# Patient Record
Sex: Female | Born: 1985 | Race: White | Hispanic: No | Marital: Married | Smoking: Never smoker
Health system: Southern US, Community
[De-identification: ages and names within clinical notes are randomized; demographics above are authoritative.]

## PROBLEM LIST (undated history)

## (undated) DIAGNOSIS — L409 Psoriasis, unspecified: Secondary | ICD-10-CM

## (undated) DIAGNOSIS — L4050 Arthropathic psoriasis, unspecified: Secondary | ICD-10-CM

## (undated) HISTORY — DX: Arthropathic psoriasis, unspecified: L40.50

## (undated) HISTORY — DX: Psoriasis, unspecified: L40.9

---

## 2006-03-02 ENCOUNTER — Other Ambulatory Visit: Admission: RE | Admit: 2006-03-02 | Discharge: 2006-03-02 | Payer: Self-pay | Admitting: *Deleted

## 2007-04-07 ENCOUNTER — Other Ambulatory Visit: Admission: RE | Admit: 2007-04-07 | Discharge: 2007-04-07 | Payer: Self-pay | Admitting: Family Medicine

## 2012-07-19 DIAGNOSIS — J309 Allergic rhinitis, unspecified: Secondary | ICD-10-CM | POA: Insufficient documentation

## 2014-06-14 ENCOUNTER — Encounter: Payer: Self-pay | Admitting: Sports Medicine

## 2014-06-14 ENCOUNTER — Ambulatory Visit (INDEPENDENT_AMBULATORY_CARE_PROVIDER_SITE_OTHER): Payer: BC Managed Care – PPO | Admitting: Sports Medicine

## 2014-06-14 VITALS — BP 117/77 | HR 72 | Ht 66.0 in | Wt 109.0 lb

## 2014-06-14 DIAGNOSIS — R5381 Other malaise: Secondary | ICD-10-CM | POA: Diagnosis not present

## 2014-06-14 DIAGNOSIS — K3189 Other diseases of stomach and duodenum: Secondary | ICD-10-CM | POA: Diagnosis not present

## 2014-06-14 DIAGNOSIS — K589 Irritable bowel syndrome without diarrhea: Secondary | ICD-10-CM

## 2014-06-14 DIAGNOSIS — R5383 Other fatigue: Secondary | ICD-10-CM | POA: Diagnosis not present

## 2014-06-14 DIAGNOSIS — Z0000 Encounter for general adult medical examination without abnormal findings: Secondary | ICD-10-CM | POA: Diagnosis not present

## 2014-06-14 DIAGNOSIS — R1013 Epigastric pain: Secondary | ICD-10-CM | POA: Insufficient documentation

## 2014-06-14 MED ORDER — HYOSCYAMINE SULFATE 0.125 MG PO TABS: 0.1250 mg | ORAL_TABLET | ORAL | Status: DC | PRN

## 2014-06-14 MED ORDER — ESOMEPRAZOLE MAGNESIUM 40 MG PO CPDR: DELAYED_RELEASE_CAPSULE | ORAL | Status: DC

## 2014-06-14 NOTE — Progress Notes (Signed)
  Subjective:    CC: Establish care.   HPI:  This is a very pleasant 28 year old female, she is previously healthy, unfortunately for the past several weeks she's noted vague epigastric pain associated with fatigue, and nausea when eating. She denies any hematochezia, hematemesis or melena. She also tells me whenever she eats she has to stool immediately. No constipation. Last menstrual period was about 5 weeks ago.  Past medical history, Surgical history, Family history not pertinant except as noted below, Social history, Allergies, and medications have been entered into the medical record, reviewed, and no changes needed.   Review of Systems: No headache, visual changes, nausea, vomiting, diarrhea, constipation, dizziness, abdominal pain, skin rash, fevers, chills, night sweats, swollen lymph nodes, weight loss, chest pain, body aches, joint swelling, muscle aches, shortness of breath, mood changes, visual or auditory hallucinations.  Objective:    General: Well Developed, well nourished, and in no acute distress.  Neuro: Alert and oriented x3, extra-ocular muscles intact, sensation grossly intact.  HEENT: Normocephalic, atraumatic, pupils equal round reactive to light, neck supple, no masses, no lymphadenopathy, thyroid nonpalpable.  Skin: Warm and dry, no rashes noted.  Cardiac: Regular rate and rhythm, no murmurs rubs or gallops.  Respiratory: Clear to auscultation bilaterally. Not using accessory muscles, speaking in full sentences.  Abdominal: Soft, nontender, nondistended, positive bowel sounds, no masses, no organomegaly.  Musculoskeletal: Shoulder, elbow, wrist, hip, knee, ankle stable, and with full range of motion.  Impression and Recommendations:    The patient was counselled, risk factors were discussed, anticipatory guidance given.

## 2014-06-14 NOTE — Assessment & Plan Note (Signed)
Most recent Pap smear was a year ago. She does have an OB/GYN in town.

## 2014-06-14 NOTE — Assessment & Plan Note (Signed)
Checking some blood work. Also checking urine pregnancy.

## 2014-06-14 NOTE — Assessment & Plan Note (Signed)
Nausea and burning epigastric pain with eating, associated with fatigue, I do suspect peptic ulcer disease with anemia. CBC, CMET, H. pylori, TSH.

## 2014-06-14 NOTE — Assessment & Plan Note (Signed)
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 Adding Levsin

## 2014-06-15 LAB — CBC
HCT: 42.2 % (ref 36.0–46.0)
Hemoglobin: 14.8 g/dL (ref 12.0–15.0)
MCH: 32.6 pg (ref 26.0–34.0)
MCHC: 35.1 g/dL (ref 30.0–36.0)
MCV: 93 fL (ref 78.0–100.0)
Platelets: 201 10*3/uL (ref 150–400)
RBC: 4.54 MIL/uL (ref 3.87–5.11)
RDW: 13.3 % (ref 11.5–15.5)
WBC: 5.6 10*3/uL (ref 4.0–10.5)

## 2014-06-16 LAB — COMPREHENSIVE METABOLIC PANEL
ALT: 10 U/L (ref 0–35)
Albumin: 4.5 g/dL (ref 3.5–5.2)
CO2: 25 mEq/L (ref 19–32)
Calcium: 9.1 mg/dL (ref 8.4–10.5)
Chloride: 105 mEq/L (ref 96–112)
Creat: 0.72 mg/dL (ref 0.50–1.10)
Glucose, Bld: 83 mg/dL (ref 70–99)
Potassium: 4.3 mEq/L (ref 3.5–5.3)
Total Bilirubin: 0.8 mg/dL (ref 0.2–1.2)
Total Protein: 6.8 g/dL (ref 6.0–8.3)

## 2014-06-16 LAB — LIPID PANEL
Cholesterol: 125 mg/dL (ref 0–200)
HDL: 65 mg/dL (ref >39)
LDL Cholesterol: 50 mg/dL (ref 0–99)
Total CHOL/HDL Ratio: 1.9 ratio
Triglycerides: 51 mg/dL (ref <150)
VLDL: 10 mg/dL (ref 0–40)

## 2014-06-16 LAB — COMPREHENSIVE METABOLIC PANEL WITH GFR
AST: 15 U/L (ref 0–37)
Alkaline Phosphatase: 36 U/L — ABNORMAL LOW (ref 39–117)
BUN: 16 mg/dL (ref 6–23)
Sodium: 138 meq/L (ref 135–145)

## 2014-06-16 LAB — TSH: TSH: 1.207 u[IU]/mL (ref 0.350–4.500)

## 2014-06-16 LAB — HEMOGLOBIN A1C
Hgb A1c MFr Bld: 5.2 % (ref <5.7)
Mean Plasma Glucose: 103 mg/dL (ref <117)

## 2014-06-16 LAB — PREGNANCY, URINE: Preg Test, Ur: NEGATIVE

## 2014-06-17 LAB — H. PYLORI ANTIBODY, IGG: H Pylori IgG: 0.54 {ISR}

## 2014-06-28 ENCOUNTER — Encounter: Payer: Self-pay | Admitting: Sports Medicine

## 2014-06-28 ENCOUNTER — Ambulatory Visit (INDEPENDENT_AMBULATORY_CARE_PROVIDER_SITE_OTHER): Payer: BC Managed Care – PPO | Admitting: Sports Medicine

## 2014-06-28 VITALS — BP 123/83 | HR 86 | Ht 66.0 in | Wt 109.0 lb

## 2014-06-28 DIAGNOSIS — Z7189 Other specified counseling: Secondary | ICD-10-CM

## 2014-06-28 DIAGNOSIS — Z7184 Encounter for health counseling related to travel: Secondary | ICD-10-CM | POA: Insufficient documentation

## 2014-06-28 DIAGNOSIS — K3189 Other diseases of stomach and duodenum: Secondary | ICD-10-CM

## 2014-06-28 DIAGNOSIS — R1013 Epigastric pain: Secondary | ICD-10-CM

## 2014-06-28 DIAGNOSIS — J309 Allergic rhinitis, unspecified: Secondary | ICD-10-CM

## 2014-06-28 DIAGNOSIS — K589 Irritable bowel syndrome without diarrhea: Secondary | ICD-10-CM

## 2014-06-28 MED ORDER — CIPROFLOXACIN HCL 500 MG PO TABS: 500.0000 mg | ORAL_TABLET | Freq: Two times a day (BID) | ORAL | Status: DC

## 2014-06-28 MED ORDER — ONDANSETRON 8 MG PO TBDP: 8.0000 mg | ORAL_TABLET | Freq: Three times a day (TID) | ORAL | Status: DC | PRN

## 2014-06-28 MED ORDER — SCOPOLAMINE 1 MG/3DAYS TD PT72: 1.0000 | MEDICATED_PATCH | TRANSDERMAL | Status: DC

## 2014-06-28 NOTE — Assessment & Plan Note (Signed)
No longer getting a good response to Allegra, switching to Claritin.

## 2014-06-28 NOTE — Assessment & Plan Note (Signed)
Traveling to [place]. We will give the typhoid vaccine, we do not have yellow fever in stock, not a big deal. I'm also going to give her Zofran and scopolamine patches, as well as Cipro for possible traveler's diarrhea.

## 2014-06-28 NOTE — Assessment & Plan Note (Signed)
Well controlled 

## 2014-06-28 NOTE — Progress Notes (Signed)
Patient ID: Alice Johnston, female   DOB: Sep 18, 1986, 28 y.o.   MRN: [id number]  Subjective:    CC: Follow-up on abdominal pain  HPI: Raelene Trew is a very pleasant, previously healthy, 28 year old female who presents today for follow-up on over one month of fatigue and abdominal discomfort suggestive of dyspepsia and diarrhea-predominant IBS. CBC, CMET, H. pylori, TSH, lipid panel, urine pregnancy test and HgbA1c done at our last visit were all negative/within normal limits. She was started on esomeprazole 40 mg daily and hyoscyamine 0.125 mg q4h as needed for cramping. She reports starting the esomeprazole three days ago and has not yet used the hyoscyamine. Symptoms have improved significantly, with no cramping or abdominal pain since our last visit. She does state that she quit her job on the same day as our last visit and has been on vacation for the past two weeks, which has meant significantly less stress and improved eating habits. Did have one episode of epigastric and substernal chest pain after eating fast food last week.  At this visit, she also wanted to discuss her longstanding and now uncontrolled allergic rhinitis, previously well-controlled with Allegra. Reports that she has taken Allegra for roughly 14 years and now has worsening rhinorrhea, nasal itching, and postnasal drip.   Also leaving for a trip to [place] tomorrow and expressed interest in medication to control motion sickness and nausea associated with traveling.  Past medical history, Surgical history, Family history not pertinant except as noted below, Social history, Allergies, and medications have been entered into the medical record, reviewed, and no changes needed.   Review of Systems: No fevers, chills, night sweats, weight loss, chest pain, or shortness of breath.   Objective:    General: Well developed, well nourished, and in no acute distress.  Neuro: Alert and oriented x3, extra-ocular muscles intact, sensation  grossly intact.  HEENT: Normocephalic, atraumatic, pupils equal round reactive to light, neck supple, no masses, no lymphadenopathy, thyroid nonpalpable.  Skin: Warm and dry, no rashes. Cardiac: Regular rate and rhythm, no murmurs rubs or gallops, no lower extremity edema.  Respiratory: Clear to auscultation bilaterally. Not using accessory muscles, speaking in full sentences. Abdominal Exam: Abdomen is soft and non-distended without masses. No tenderness to palpation.  Impression and Recommendations:    Irritable Bowel Syndrome: Well-controlled on current regimen. - Continue hyoscyamine 0.125 mg q4h PRN for cramping/pain  Dyspepsia: Well-controlled on current regimen. - Continue esomeprazole 40 mg daily  Allergic Rhinitis: While her allergies were previously well-controlled with Allegra, the decreasing response with long-term use suggests that we should switch to another medication. - d/c Allegra - Claritin  Travel to [place]: We discussed the benefits of receiving vaccinations prior to traveling. Typhoid and yellow fever vaccines are recommended for her; however, we do not have yellow fever in stock. She will think about if she would like to receive the typhoid vaccine. - Scopolamine and Zofran given for motion sickness - Cipro given for possible traveler's diarrhea

## 2014-07-13 ENCOUNTER — Ambulatory Visit (INDEPENDENT_AMBULATORY_CARE_PROVIDER_SITE_OTHER): Payer: BC Managed Care – PPO | Admitting: Physician Assistant

## 2014-07-13 ENCOUNTER — Encounter: Payer: Self-pay | Admitting: Physician Assistant

## 2014-07-13 VITALS — BP 121/81 | HR 85 | Ht 66.0 in | Wt 111.0 lb

## 2014-07-13 DIAGNOSIS — R300 Dysuria: Secondary | ICD-10-CM

## 2014-07-13 DIAGNOSIS — K589 Irritable bowel syndrome without diarrhea: Secondary | ICD-10-CM

## 2014-07-13 LAB — POCT URINALYSIS DIPSTICK
Bilirubin, UA: NEGATIVE
Glucose, UA: NEGATIVE
Ketones, UA: NEGATIVE
LEUKOCYTES UA: NEGATIVE
NITRITE UA: NEGATIVE
PH UA: 7
Protein, UA: NEGATIVE
RBC UA: NEGATIVE
Spec Grav, UA: 1.015
UROBILINOGEN UA: 0.2

## 2014-07-13 NOTE — Progress Notes (Signed)
   Subjective:    Patient ID: Alice Johnston, female    DOB: 02-17-1986, 28 y.o.   MRN: [id number]  HPI Pt presents to the clinic with some dysuria about 3 days ago after finishing 3 days of cipro on her [place] vacation. She ended up having UTI symptoms on vacation and started cipro that was preventively given. Symptoms resolved but then she had one day of urinary pain and increased frequency. She wants to make sure infection gone. Denies any vaginal discharge. No pain today. No fever, chills, nausea or vomiting.   She mentions she had seen for IBS by Dr. Megan Salon. She wanted to see if she had been tested for celiac disease and if not to be tested. She does eat a lot of bread and pasta. She is having a lot of diarrhea to constipation and abdominal pain off and on. She certainly admits to a level of anxiety as well. She has not started hyoscyamine.    Review of Systems  All other systems reviewed and are negative.      Objective:   Physical Exam  Constitutional: She is oriented to person, place, and time. She appears well-developed and well-nourished.  HENT:  Head: Normocephalic and atraumatic.  Cardiovascular: Normal rate, regular rhythm and normal heart sounds.   Pulmonary/Chest: Effort normal and breath sounds normal.  No CVA tenderness.   Abdominal: Soft. She exhibits no distension and no mass. There is no tenderness. There is no rebound and no guarding.  Hyperactive bowel sounds.  No tenderness.   Neurological: She is alert and oriented to person, place, and time.  Skin: Skin is dry.  Psychiatric: She has a normal mood and affect. Her behavior is normal.          Assessment & Plan:  Dysuria- .. Results for orders placed in visit on 07/13/14  POCT URINALYSIS DIPSTICK      Result Value Ref Range   Color, UA yellow     Clarity, UA slightly cloudy     Glucose, UA neg     Bilirubin, UA neg     Ketones, UA neg     Spec Grav, UA 1.015     Blood, UA neg     pH, UA 7.0      Protein, UA neg     Urobilinogen, UA 0.2     Nitrite, UA neg     Leukocytes, UA Negative      Will culture. Discussed infection has resolved and looks fine. If still having symptoms restart follow up with PCP.   IBS- discussed with pt symptoms sound consistent with IBS. Gave IbS diet and encouraged to try hyoscyamine. If not improving try gluten free for 2 weeks. I did order celiac panel. breif discussion about anxiety worsening IBS symptoms and perhaps SSRI therapy could help. Probiotic encouraged. Follow up with PCP for further management.   Spent 30 minutes with pt and greater than 50 percent of visit spent counseling pt on IBS treatment plan.

## 2014-07-13 NOTE — Patient Instructions (Addendum)
Irritable Bowel Syndrome Irritable bowel syndrome (IBS) is caused by a disturbance of normal bowel function and is a common digestive disorder. You may also hear this condition called spastic colon, mucous colitis, and irritable colon. There is no cure for IBS. However, symptoms often gradually improve or disappear with a good diet, stress management, and medicine. This condition usually appears in late adolescence or early adulthood. Women develop it twice as often as men. CAUSES  After food has been digested and absorbed in the small intestine, waste material is moved into the large intestine, or colon. In the colon, water and salts are absorbed from the undigested products coming from the small intestine. The remaining residue, or fecal material, is held for elimination. Under normal circumstances, gentle, rhythmic contractions of the bowel walls push the fecal material along the colon toward the rectum. In IBS, however, these contractions are irregular and poorly coordinated. The fecal material is either retained too long, resulting in constipation, or expelled too soon, producing diarrhea. SIGNS AND SYMPTOMS  The most common symptom of IBS is abdominal pain. It is often in the lower left side of the abdomen, but it may occur anywhere in the abdomen. The pain comes from spasms of the bowel muscles happening too much and from the buildup of gas and fecal material in the colon. This pain:  Can range from sharp abdominal cramps to a dull, continuous ache.  Often worsens soon after eating.  Is often relieved by having a bowel movement or passing gas. Abdominal pain is usually accompanied by constipation, but it may also produce diarrhea. The diarrhea often occurs right after a meal or upon waking up in the morning. The stools are often soft, watery, and flecked with mucus. Other symptoms of IBS include:  Bloating.  Loss of appetite.  Heartburn.  Backache.  Dull pain in the arms or  shoulders.  Nausea.  Burping.  Vomiting.  Gas. IBS may also cause symptoms that are unrelated to the digestive system, such as:  Fatigue.  Headaches.  Anxiety.  Shortness of breath.  Trouble concentrating.  Dizziness. These symptoms tend to come and go. DIAGNOSIS  The symptoms of IBS may seem like symptoms of other, more serious digestive disorders. Your health care provider may want to perform tests to exclude these disorders.  TREATMENT Many medicines are available to help correct bowel function or relieve bowel spasms and abdominal pain. Among the medicines available are:  Laxatives for severe constipation and to help restore normal bowel habits.  Specific antidiarrheal medicines to treat severe or lasting diarrhea.  Antispasmodic agents to relieve intestinal cramps. Your health care provider may also decide to treat you with a mild tranquilizer or sedative during unusually stressful periods in your life. Your health care provider may also prescribe antidepressant medicine. The use of this medicine has been shown to reduce pain and other symptoms of IBS. Remember that if any medicine is prescribed for you, you should take it exactly as directed. Make sure your health care provider knows how well it worked for you. HOME CARE INSTRUCTIONS   Take all medicines as directed by your health care provider.  Avoid foods that are high in fat or oils, such as heavy cream, butter, frankfurters, sausage, and other fatty meats.  Avoid foods that make you go to the bathroom, such as fruit, fruit juice, and dairy products.  Cut out carbonated drinks, chewing gum, and "gassy" foods such as beans and cabbage. This may help relieve bloating and burping.    Eat foods with bran, and drink plenty of liquids with the bran foods. This helps relieve constipation.  Keep track of what foods seem to bring on your symptoms.  Avoid emotionally charged situations or circumstances that produce  anxiety.  Start or continue exercising.  Get plenty of rest and sleep. Document Released: 10/12/2005 Document Revised: 10/17/2013 Document Reviewed: 06/01/2008 ExitCare Patient Information 2015 [employer]. This information is not intended to replace advice given to you by your health care provider. Make sure you discuss any questions you have with your health care provider. Diet and Irritable Bowel Syndrome  No cure has been found for irritable bowel syndrome (IBS). Many options are available to treat the symptoms. Your caregiver will give you the best treatments available for your symptoms. He or she will also encourage you to manage stress and to make changes to your diet. You need to work with your caregiver and Registered Dietician to find the best combination of medicine, diet, counseling, and support to control your symptoms. The following are some diet suggestions. FOODS THAT MAKE IBS WORSE  Fatty foods, such as French fries.  Milk products, such as cheese or ice cream.  Chocolate.  Alcohol.  Caffeine (found in coffee and some sodas).  Carbonated drinks, such as soda. If certain foods cause symptoms, you should eat less of them or stop eating them. FOOD JOURNAL   Keep a journal of the foods that seem to cause distress. Write down:  What you are eating during the day and when.  What problems you are having after eating.  When the symptoms occur in relation to your meals.  What foods always make you feel badly.  Take your notes with you to your caregiver to see if you should stop eating certain foods. FOODS THAT MAKE IBS BETTER Fiber reduces IBS symptoms, especially constipation, because it makes stools soft, bulky, and easier to pass. Fiber is found in bran, bread, cereal, beans, fruit, and vegetables. Examples of foods with fiber include:  Apples.  Peaches.  Pears.  Berries.  Figs.  Broccoli, raw.  Cabbage.  Carrots.  Raw peas.  Kidney  beans.  Lima beans.  Whole-grain bread.  Whole-grain cereal. Add foods with fiber to your diet a little at a time. This will let your body get used to them. Too much fiber at once might cause gas and swelling of your abdomen. This can trigger symptoms in a person with IBS. Caregivers usually recommend a diet with enough fiber to produce soft, painless bowel movements. High fiber diets may cause gas and bloating. However, these symptoms often go away within a few weeks, as your body adjusts. In many cases, dietary fiber may lessen IBS symptoms, particularly constipation. However, it may not help pain or diarrhea. High fiber diets keep the colon mildly enlarged (distended) with the added fiber. This may help prevent spasms in the colon. Some forms of fiber also keep water in the stool, thereby preventing hard stools that are difficult to pass.  Besides telling you to eat more foods with fiber, your caregiver may also tell you to get more fiber by taking a fiber pill or drinking water mixed with a special high fiber powder. An example of this is a natural fiber laxative containing psyllium seed.  TIPS  Large meals can cause cramping and diarrhea in people with IBS. If this happens to you, try eating 4 or 5 small meals a day, or try eating less at each of your usual 3 meals.   It may also help if your meals are low in fat and high in carbohydrates. Examples of carbohydrates are pasta, rice, whole-grain breads and cereals, fruits, and vegetables.  If dairy products cause your symptoms to flare up, you can try eating less of those foods. You might be able to handle yogurt better than other dairy products, because it contains bacteria that helps with digestion. Dairy products are an important source of calcium and other nutrients. If you need to avoid dairy products, be sure to talk with a Registered Dietitian about getting these nutrients through other food sources.  Drink enough water and fluids to keep  your urine clear or pale yellow. This is important, especially if you have diarrhea. FOR MORE INFORMATION  International Foundation for Functional Gastrointestinal Disorders: www.iffgd.org  National Digestive Diseases Information Clearinghouse: digestive.niddk.nih.gov Document Released: 01/02/2004 Document Revised: 01/04/2012 Document Reviewed: 01/12/2014 ExitCare Patient Information 2015 [employer]. This information is not intended to replace advice given to you by your health care provider. Make sure you discuss any questions you have with your health care provider.  

## 2014-07-15 LAB — URINE CULTURE
Colony Count: NO GROWTH
Organism ID, Bacteria: NO GROWTH

## 2014-07-16 LAB — GLIA (IGA/G) + TTG IGA
Gliadin IgA: 8.6 U/mL (ref <20)
Gliadin IgG: 7.3 U/mL (ref <20)
TISSUE TRANSGLUTAMINASE AB, IGA: 3.2 U/mL (ref <20)

## 2014-08-30 ENCOUNTER — Ambulatory Visit (INDEPENDENT_AMBULATORY_CARE_PROVIDER_SITE_OTHER): Payer: BC Managed Care – PPO | Admitting: Sports Medicine

## 2014-08-30 ENCOUNTER — Encounter: Payer: Self-pay | Admitting: Sports Medicine

## 2014-08-30 ENCOUNTER — Telehealth: Payer: Self-pay | Admitting: *Deleted

## 2014-08-30 DIAGNOSIS — K589 Irritable bowel syndrome without diarrhea: Secondary | ICD-10-CM

## 2014-08-30 NOTE — Telephone Encounter (Signed)
Approval obtained via portal. Valid 11/5-12/4/15. [id number]. Radiology notified. Corliss SkainsJamie Kenlee Maler, CMA

## 2014-08-30 NOTE — Assessment & Plan Note (Signed)
Persistent tenesmus with eating, usually larger meals such as dinner to the point where she avoids going out. Celiac panel was negative. Has not yet started Levsin. Again discussed increasing fiber in the diet. Try Levsin, we could certainly try one of the newer IBS medications if this doesn't work. Her pain is now constant and left lower quadrant so we will obtain a CT of the abdomen and pelvis. Recent visit with the gynecologist was negative. Return to see me in one month. I do not think she will need a colonoscopy unless we see something on the CT scan, or if conservative measures fail. We also discussed treatment of her anxiety, we can keep this in the back of our head, and pull the trigger for treatment if typical agents fail.

## 2014-08-30 NOTE — Progress Notes (Signed)
  Subjective:    CC: follow-up  HPI: Irritable bowel syndrome: Alice Johnston returns, she still has not taken her Levsin, not unexpectedly continues to have symptoms, she tells me she gets great anxiety from not being able to go out and eat with the worry that she will have to take an immediate bowel movement. This typically occurs at dinner and larger meals. She did see one of my partners who obtained a celiac panel that was negative. She now endorses pain that is constant in the left lower quadrant, no melena or hematochezia, no nausea. Symptoms continue to resemble irritable bowel syndrome. She is not yet ready to consider treatment of her anxiety.  Past medical history, Surgical history, Family history not pertinant except as noted below, Social history, Allergies, and medications have been entered into the medical record, reviewed, and no changes needed.   Review of Systems: No fevers, chills, night sweats, weight loss, chest pain, or shortness of breath.   Objective:    General: Well Developed, well nourished, and in no acute distress.  Neuro: Alert and oriented x3, extra-ocular muscles intact, sensation grossly intact.  HEENT: Normocephalic, atraumatic, pupils equal round reactive to light, neck supple, no masses, no lymphadenopathy, thyroid nonpalpable.  Skin: Warm and dry, no rashes. Cardiac: Regular rate and rhythm, no murmurs rubs or gallops, no lower extremity edema.  Respiratory: Clear to auscultation bilaterally. Not using accessory muscles, speaking in full sentences. Abdomen: Soft, mild left lower quadrant tenderness, nondistended, normal bowel sounds, no palpable masses.  Impression and Recommendations:

## 2014-09-03 ENCOUNTER — Ambulatory Visit (INDEPENDENT_AMBULATORY_CARE_PROVIDER_SITE_OTHER): Payer: BC Managed Care – PPO

## 2014-09-03 DIAGNOSIS — R109 Unspecified abdominal pain: Secondary | ICD-10-CM

## 2014-09-03 DIAGNOSIS — K589 Irritable bowel syndrome without diarrhea: Secondary | ICD-10-CM

## 2014-09-03 MED ORDER — IOHEXOL 300 MG/ML  SOLN
100.0000 mL | Freq: Once | INTRAMUSCULAR | Status: AC | PRN
  Administered 2014-09-03: 100 mL via INTRAVENOUS

## 2014-10-01 ENCOUNTER — Ambulatory Visit (INDEPENDENT_AMBULATORY_CARE_PROVIDER_SITE_OTHER): Payer: BC Managed Care – PPO | Admitting: Sports Medicine

## 2014-10-01 ENCOUNTER — Encounter: Payer: Self-pay | Admitting: Sports Medicine

## 2014-10-01 DIAGNOSIS — K589 Irritable bowel syndrome without diarrhea: Secondary | ICD-10-CM | POA: Diagnosis not present

## 2014-10-01 MED ORDER — HYOSCYAMINE SULFATE ER 0.375 MG PO TB12: ORAL_TABLET | ORAL | Status: DC

## 2014-10-01 NOTE — Assessment & Plan Note (Signed)
Symptoms have now essentially resolved, finally tried the hyoscyamine. Using it 2-3 times per day, I am going to try to find an extended release form for her.

## 2014-10-01 NOTE — Progress Notes (Signed)
  Subjective:    CC: Follow-up  HPI: Alice Johnston is exquisitely pleasant 28 year old female, I diagnosed her with irritable bowel syndrome, symptoms were diarrhea predominant and centered around larger meals. We started Levsin, she had not yet tried any. More recently she developed increasing left lower quadrant abdominal pain, and did request advanced imaging. We complied, in the form of the CT of the abdomen and pelvis with IV and oral contrast which was expectedly normal. More recently she did try her Levsin and was tremendously effective, she is extremely happy with the results so far.  Past medical history, Surgical history, Family history not pertinant except as noted below, Social history, Allergies, and medications have been entered into the medical record, reviewed, and no changes needed.   Review of Systems: No fevers, chills, night sweats, weight loss, chest pain, or shortness of breath.   Objective:    General: Well Developed, well nourished, and in no acute distress.  Neuro: Alert and oriented x3, extra-ocular muscles intact, sensation grossly intact.  HEENT: Normocephalic, atraumatic, pupils equal round reactive to light, neck supple, no masses, no lymphadenopathy, thyroid nonpalpable.  Skin: Warm and dry, no rashes. Cardiac: Regular rate and rhythm, no murmurs rubs or gallops, no lower extremity edema.  Respiratory: Clear to auscultation bilaterally. Not using accessory muscles, speaking in full sentences.  Impression and Recommendations:

## 2014-10-30 ENCOUNTER — Other Ambulatory Visit: Payer: Self-pay

## 2014-10-30 DIAGNOSIS — K589 Irritable bowel syndrome without diarrhea: Secondary | ICD-10-CM

## 2014-10-30 MED ORDER — HYOSCYAMINE SULFATE ER 0.375 MG PO TB12: ORAL_TABLET | ORAL | Status: DC

## 2014-12-31 ENCOUNTER — Ambulatory Visit (INDEPENDENT_AMBULATORY_CARE_PROVIDER_SITE_OTHER): Payer: BLUE CROSS/BLUE SHIELD | Admitting: Sports Medicine

## 2014-12-31 ENCOUNTER — Encounter: Payer: Self-pay | Admitting: Sports Medicine

## 2014-12-31 VITALS — BP 112/67 | HR 68 | Ht 67.0 in | Wt 103.0 lb

## 2014-12-31 DIAGNOSIS — K589 Irritable bowel syndrome without diarrhea: Secondary | ICD-10-CM

## 2014-12-31 DIAGNOSIS — Z681 Body mass index (BMI) 19 or less, adult: Secondary | ICD-10-CM

## 2014-12-31 DIAGNOSIS — Z0000 Encounter for general adult medical examination without abnormal findings: Secondary | ICD-10-CM

## 2014-12-31 NOTE — Assessment & Plan Note (Signed)
Doing extremely well with current extended release Levsin. It is somewhat expensive so she will let me know later when if she wants to switch to immediate release.

## 2014-12-31 NOTE — Assessment & Plan Note (Signed)
Pap smear was last summer and clear.

## 2014-12-31 NOTE — Assessment & Plan Note (Signed)
Normal CBC, metabolic panel, TSH. She does have some mild anxiety, exercise is adequate, and not excessive. I do think she simply constitutionally has a low body weight. No treatment needed.

## 2014-12-31 NOTE — Progress Notes (Signed)
  Subjective:    CC:  Follow up on IBS symptoms  HPI: Patient reports that she continues to have significant relief of her symptoms on the extended release Levsin. She can only recall a single episode of abdominal pain and fecal urgency over the past month, which she attributes to a viral gastroenteritis she had at the time. Today she complains of an unintended 15 lb weight loss over the past year.The weight loss has worried her boyfriend but she feels otherwise well, denies fatigue, fever, chills, night sweats, abdominal pain. The patient describes her healthy and balanced diet, and her moderate exercise of jogging 3-4 times per week. She reports that she has not performed weight bearing exercise over the past 6 months at a the recommendation of her chiropractor, but she can now. She also reports that she sometimes feels anxious despite a recent decrease in her life stressors with a new job.   Past medical history, Surgical history, Family history not pertinant except as noted below, Social history, Allergies, and medications have been entered into the medical record, reviewed, and no changes needed.   Review of Systems: No fevers, chills, night sweats, weight loss, chest pain, or shortness of breath.   Objective:    General: Well Developed, well nourished, and in no acute distress.  Neuro: Alert and oriented x3, extra-ocular muscles intact, sensation grossly intact.  HEENT: Normocephalic, atraumatic, pupils equal round reactive to light, neck supple, no masses, no lymphadenopathy, thyroid nonpalpable.  Skin: Warm and dry, no rashes. Cardiac: Regular rate and rhythm, no murmurs rubs or gallops, no lower extremity edema.  Respiratory: Clear to auscultation bilaterally. Not using accessory muscles, speaking in full sentences.   Impression and Recommendations:    # Irritable Bowel Syndrome - Patient's symptoms have resolved with medical management - Continue hyoscyamine 0.375 extended release  formula daily with lunch  # Low BMI - Body mass index is 16.13 kg/(m^2). today - Patient does not appear to be over-exercising, or restricting her diet - Metabolic workup was negative with CBC and TSH both WNL - Provided reassurance, encouraged patient to maintain a healthy diet and return to moderate weight-bearing exercise  # Anxiety - Patient will fill out PHQ9 and GAD forms today   Follow up in 6 months or as needed

## 2015-07-08 ENCOUNTER — Encounter: Payer: Self-pay | Admitting: Sports Medicine

## 2015-07-08 ENCOUNTER — Ambulatory Visit (INDEPENDENT_AMBULATORY_CARE_PROVIDER_SITE_OTHER): Payer: BLUE CROSS/BLUE SHIELD | Admitting: Sports Medicine

## 2015-07-08 VITALS — BP 105/64 | HR 74 | Ht 65.0 in | Wt 103.0 lb

## 2015-07-08 DIAGNOSIS — K589 Irritable bowel syndrome without diarrhea: Secondary | ICD-10-CM

## 2015-07-08 DIAGNOSIS — Z681 Body mass index (BMI) 19 or less, adult: Secondary | ICD-10-CM

## 2015-07-08 MED ORDER — HYOSCYAMINE SULFATE ER 0.375 MG PO TB12: 0.3750 mg | ORAL_TABLET | Freq: Every day | ORAL | Status: DC

## 2015-07-08 NOTE — Assessment & Plan Note (Signed)
Doing extremely well on Levbid. Refilling for one year.

## 2015-07-08 NOTE — Assessment & Plan Note (Signed)
We discussed various options to gain weight, both pharmacologic, and physiologic. Options would include mirtazapine, Megace, Marinol, and Depo-Provera. She is probably going to consider Depo-Provera, she will come back for nurse visit, we would need a pregnancy test first.

## 2015-07-08 NOTE — Progress Notes (Signed)
  Subjective:    CC: Follow-up  HPI: Alice Johnston is a very pleasant 29 year old female with diarrhea predominant irritable bowel syndrome, she has done extremely well with Levbid, and would like a refill, symptoms are essentially completely controlled.  Low BMI: Alice Johnston bodyweight is constitutionally low, she has had a normal TSH, complete metabolic panel, CBC. We went over her diet, this appears to be adequate. She does desire to think about pharmacologic intervention for weight gain. She only runs 1 mile twice a week.  Past medical history, Surgical history, Family history not pertinant except as noted below, Social history, Allergies, and medications have been entered into the medical record, reviewed, and no changes needed.   Review of Systems: No fevers, chills, night sweats, weight loss, chest pain, or shortness of breath.   Objective:    General: Well Developed, well nourished, and in no acute distress.  Neuro: Alert and oriented x3, extra-ocular muscles intact, sensation grossly intact.  HEENT: Normocephalic, atraumatic, pupils equal round reactive to light, neck supple, no masses, no lymphadenopathy, thyroid nonpalpable.  Skin: Warm and dry, no rashes. Cardiac: Regular rate and rhythm, no murmurs rubs or gallops, no lower extremity edema.  Respiratory: Clear to auscultation bilaterally. Not using accessory muscles, speaking in full sentences.  Impression and Recommendations:    I spent 40 minutes with this patient, greater than 50% was face-to-face time counseling regarding the above diagnoses

## 2015-12-12 ENCOUNTER — Ambulatory Visit (INDEPENDENT_AMBULATORY_CARE_PROVIDER_SITE_OTHER): Payer: BLUE CROSS/BLUE SHIELD | Admitting: Sports Medicine

## 2015-12-12 ENCOUNTER — Encounter: Payer: Self-pay | Admitting: Sports Medicine

## 2015-12-12 VITALS — BP 122/72 | HR 74 | Resp 16 | Wt 108.0 lb

## 2015-12-12 DIAGNOSIS — K580 Irritable bowel syndrome with diarrhea: Secondary | ICD-10-CM

## 2015-12-12 DIAGNOSIS — Z3009 Encounter for other general counseling and advice on contraception: Secondary | ICD-10-CM

## 2015-12-12 DIAGNOSIS — Z681 Body mass index (BMI) 19 or less, adult: Secondary | ICD-10-CM

## 2015-12-12 DIAGNOSIS — F411 Generalized anxiety disorder: Secondary | ICD-10-CM | POA: Insufficient documentation

## 2015-12-12 LAB — POCT URINE PREGNANCY: Preg Test, Ur: NEGATIVE

## 2015-12-12 MED ORDER — ELUXADOLINE 75 MG PO TABS: 1.0000 | ORAL_TABLET | Freq: Two times a day (BID) | ORAL | Status: DC

## 2015-12-12 MED ORDER — HYOSCYAMINE SULFATE 0.125 MG PO TABS: 0.2500 mg | ORAL_TABLET | Freq: Two times a day (BID) | ORAL | Status: DC | PRN

## 2015-12-12 MED ORDER — MEDROXYPROGESTERONE ACETATE 150 MG/ML IM SUSP
150.0000 mg | Freq: Once | INTRAMUSCULAR | Status: AC
  Administered 2015-12-12: 150 mg via INTRAMUSCULAR

## 2015-12-12 NOTE — Addendum Note (Signed)
Addended by: Baird Kay on: 12/12/2015 04:17 PM   Modules accepted: Orders, Medications

## 2015-12-12 NOTE — Assessment & Plan Note (Signed)
Adding Depo-Provera.

## 2015-12-12 NOTE — Assessment & Plan Note (Signed)
Urine pregnancy test is negative, return for Depo-Provera shot every 3 months.

## 2015-12-12 NOTE — Assessment & Plan Note (Signed)
We discussed generalized anxiety, its association with irritable bowel syndrome and treatment including SSRIs, behavioral therapy, as well as the mechanism of an SSRI. She is going to think about it and call me and let me know whether she wants to start something, we will probably start with Celexa.

## 2015-12-12 NOTE — Assessment & Plan Note (Signed)
Well-controlled on Levbid, unfortunately no longer covered by insurance. Switching to standard release Levsin, adding Viberzi as well with coupon. If this is too expensive we can certainly consider Bentyl.

## 2015-12-12 NOTE — Addendum Note (Signed)
Addended by: Baird Kay on: 12/12/2015 04:14 PM   Modules accepted: Orders

## 2015-12-12 NOTE — Progress Notes (Signed)
  Subjective:    CC: Couple of issues  HPI: Alice Johnston is a pleasant 30 year old female, she comes in for follow-up of her diarrhea predominant urine will bowel syndrome, she had a fantastic response to Levbid, unfortunately her insurance will no longer cover it.  Anxiety: Notes moderate nervousness, worrying about different things, irritability, and mild difficulty controlling her for a, if the relaxing, and fear of impending doom, no suicidal or homicidal ideation.  Underweight: Is agreeable to try Depo-Provera as her form of birth control, as this will also help her to gain some weight.  Past medical history, Surgical history, Family history not pertinant except as noted below, Social history, Allergies, and medications have been entered into the medical record, reviewed, and no changes needed.   Review of Systems: No fevers, chills, night sweats, weight loss, chest pain, or shortness of breath.   Objective:    General: Well Developed, well nourished, and in no acute distress.  Neuro: Alert and oriented x3, extra-ocular muscles intact, sensation grossly intact.  HEENT: Normocephalic, atraumatic, pupils equal round reactive to light, neck supple, no masses, no lymphadenopathy, thyroid nonpalpable.  Skin: Warm and dry, no rashes. Cardiac: Regular rate and rhythm, no murmurs rubs or gallops, no lower extremity edema.  Respiratory: Clear to auscultation bilaterally. Not using accessory muscles, speaking in full sentences.  Impression and Recommendations:    I spent 25 minutes with this patient, greater than 50% was face-to-face time counseling regarding the above diagnoses

## 2016-01-09 ENCOUNTER — Ambulatory Visit: Payer: BLUE CROSS/BLUE SHIELD | Admitting: Sports Medicine

## 2016-01-30 ENCOUNTER — Encounter (HOSPITAL_BASED_OUTPATIENT_CLINIC_OR_DEPARTMENT_OTHER): Payer: Self-pay | Admitting: *Deleted

## 2016-01-30 ENCOUNTER — Emergency Department (HOSPITAL_BASED_OUTPATIENT_CLINIC_OR_DEPARTMENT_OTHER)
Admission: EM | Admit: 2016-01-30 | Discharge: 2016-01-31 | Disposition: A | Discharge status: Home / Self Care | Payer: BLUE CROSS/BLUE SHIELD | Attending: Emergency Medicine | Admitting: Emergency Medicine

## 2016-01-30 DIAGNOSIS — N926 Irregular menstruation, unspecified: Secondary | ICD-10-CM | POA: Diagnosis present

## 2016-01-30 DIAGNOSIS — Z0389 Encounter for observation for other suspected diseases and conditions ruled out: Secondary | ICD-10-CM | POA: Insufficient documentation

## 2016-01-30 NOTE — ED Notes (Signed)
Pt states she thinks she has a tampon stuck in her vagina.

## 2016-01-30 NOTE — ED Notes (Signed)
MD at bedside. 

## 2016-01-30 NOTE — ED Notes (Signed)
Tampon placed at 1830 and was attempted to be taken out to an hour ago and she cant find the string to the tampon or feel the tampon.

## 2016-01-30 NOTE — ED Provider Notes (Signed)
CSN: [id number]     Arrival date & time 01/30/16  2325 History  By signing my name below, I, [name], attest that this documentation has been prepared under the direction and in the presence of Elisia Stepp, MD. Electronically Signed: [name], ED Scribe. 01/30/2016. 11:49 PM.    Chief Complaint  Patient presents with  . Foreign Body in Vagina   Patient is a 30 y.o. female presenting with foreign body in vagina. The history is provided by the patient. No language interpreter was used.  Foreign Body in Vagina This is a new problem. The current episode started 1 to 2 hours ago. The problem occurs constantly. The problem has not changed since onset.Pertinent negatives include no chest pain, no abdominal pain, no headaches and no shortness of breath. Nothing aggravates the symptoms. Nothing relieves the symptoms. She has tried nothing (removal) for the symptoms. The treatment provided no relief.   HPI Comments: Alice Johnston is a 30 y.o. female who presents to the Emergency Department complaining that she has been unable to remove the tampon that she inserted at 6:30PM. Pt states she noticed that she was unable to find the string one hour ago. She has urinated since insertion and has not seen or felt the tampon fall out at any point. Pt states she also had her boyfriend check for the tampon with no success. Denies vaginal pain.   History reviewed. No pertinent past medical history. History reviewed. No pertinent past surgical history. Family History  Problem Relation Age of Onset  . Depression Mother   . Depression Father   . Diabetes Father   . Hypertension Father   . Stroke Father   . Cancer Maternal Uncle   . Heart attack Paternal Uncle   . Cancer Paternal Grandfather   . Heart attack Paternal Grandfather   . Hypertension Paternal Grandfather    Social History  Substance Use Topics  . Smoking status: Never Smoker   . Smokeless tobacco: None  . Alcohol Use: Yes   OB History     No data available     Review of Systems  Respiratory: Negative for shortness of breath.   Cardiovascular: Negative for chest pain.  Gastrointestinal: Negative for abdominal pain.  Genitourinary: Negative for vaginal pain.       +foreign body sensation to vagina   Neurological: Negative for headaches.  All other systems reviewed and are negative.  Allergies  Review of patient's allergies indicates no known allergies.  Home Medications   Prior to Admission medications   Medication Sig Start Date End Date Taking? Authorizing Provider  Eluxadoline (VIBERZI) 75 MG TABS Take 1 tablet by mouth 2 (two) times daily. 12/12/15   Monica Becton, MD  hyoscyamine (LEVSIN, ANASPAZ) 0.125 MG tablet Take 2 tablets (0.25 mg total) by mouth 2 (two) times daily as needed for cramping. 12/12/15   Monica Becton, MD  Loratadine (CLARITIN PO) Take by mouth as needed.    Historical Provider, MD   BP 128/91 mmHg  Pulse 80  Temp(Src) 97.6 F (36.4 C)  Resp 18  Ht  (1.676 m)  Wt 110 lb (49.896 kg)  BMI 17.76 kg/m2  SpO2 100%  LMP 01/30/2016 Physical Exam  Constitutional: She is oriented to person, place, and time. She appears well-developed and well-nourished.  HENT:  Head: Normocephalic and atraumatic.  Mouth/Throat: Oropharynx is clear and moist. No oropharyngeal exudate.  Eyes: Conjunctivae and EOM are normal. Pupils are equal, round, and reactive to light.  Neck: Normal range of motion. Neck supple. No JVD present. No tracheal deviation present.  Cardiovascular: Normal rate, regular rhythm and normal heart sounds.  Exam reveals no gallop and no friction rub.   No murmur heard. RRR.   Pulmonary/Chest: Effort normal and breath sounds normal. No stridor. No respiratory distress. She has no wheezes. She has no rales.  Lungs CTA bilaterally.   Abdominal: Soft. Bowel sounds are normal. She exhibits no distension. There is no tenderness. There is no rebound and no guarding.   Genitourinary: Vagina normal. No vaginal discharge found.  Two chaperones present throughout entire exam.  No foreign body. Nurse Lupita Leashonna confirmed that there was no foreign body with speculum exam in vaginal vault. Cervical os closed. Scant vaginal bleeding.   Musculoskeletal: Normal range of motion.  Lymphadenopathy:    She has no cervical adenopathy.  Neurological: She is alert and oriented to person, place, and time. She has normal reflexes.  Skin: Skin is warm and dry.  Psychiatric: She has a normal mood and affect.  Nursing note and vitals reviewed.   ED Course  Procedures (including critical care time) DIAGNOSTIC STUDIES: Oxygen Saturation is 100% on RA, normal by my interpretation.    COORDINATION OF CARE: 11:44 PM Discussed treatment plan with pt at bedside which includes tampon removal and pt agreed to plan. If no foreign body, f/u with OB/GYN in 48 hours for recheck. No foreign body on speculum exam. Nurse Lupita LeashDonna confirmed that there was no foreign body with speculum exam in vaginal vault. Pt stable for discharge.     MDM   Final diagnoses:  Menstrual problem    Well appearing no tampon nor string visible.  No CMT.  Os is closed suspect it fell out earlier and patient was unaware of its absence.  Stable for discharge at this time  I personally performed the services described in this documentation, which was scribed in my presence. The recorded information has been reviewed and is accurate.     Cy BlamerApril Britt Petroni, MD 01/31/16 [phone]

## 2016-01-31 ENCOUNTER — Encounter (HOSPITAL_BASED_OUTPATIENT_CLINIC_OR_DEPARTMENT_OTHER): Payer: Self-pay | Admitting: Emergency Medicine

## 2016-02-04 ENCOUNTER — Ambulatory Visit: Payer: BLUE CROSS/BLUE SHIELD | Admitting: Sports Medicine

## 2016-02-11 ENCOUNTER — Ambulatory Visit: Payer: BLUE CROSS/BLUE SHIELD | Admitting: Sports Medicine

## 2016-02-25 ENCOUNTER — Ambulatory Visit (INDEPENDENT_AMBULATORY_CARE_PROVIDER_SITE_OTHER): Payer: BLUE CROSS/BLUE SHIELD

## 2016-02-25 ENCOUNTER — Other Ambulatory Visit: Payer: Self-pay | Admitting: Sports Medicine

## 2016-02-25 ENCOUNTER — Ambulatory Visit (INDEPENDENT_AMBULATORY_CARE_PROVIDER_SITE_OTHER): Payer: BLUE CROSS/BLUE SHIELD | Admitting: Sports Medicine

## 2016-02-25 ENCOUNTER — Encounter: Payer: Self-pay | Admitting: Sports Medicine

## 2016-02-25 VITALS — BP 117/70 | HR 78 | Resp 16 | Wt 111.0 lb

## 2016-02-25 DIAGNOSIS — S6992XA Unspecified injury of left wrist, hand and finger(s), initial encounter: Secondary | ICD-10-CM

## 2016-02-25 MED ORDER — TRAMADOL HCL 50 MG PO TABS: ORAL_TABLET | ORAL | Status: DC
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
## 2016-02-27 ENCOUNTER — Ambulatory Visit: Payer: BLUE CROSS/BLUE SHIELD | Admitting: Sports Medicine

## 2016-03-03 ENCOUNTER — Ambulatory Visit: Payer: BLUE CROSS/BLUE SHIELD

## 2016-03-04 ENCOUNTER — Ambulatory Visit (INDEPENDENT_AMBULATORY_CARE_PROVIDER_SITE_OTHER): Payer: BLUE CROSS/BLUE SHIELD | Admitting: Sports Medicine

## 2016-03-04 VITALS — BP 125/80 | HR 96 | Ht 65.0 in | Wt 109.0 lb

## 2016-03-04 DIAGNOSIS — Z309 Encounter for contraceptive management, unspecified: Secondary | ICD-10-CM | POA: Diagnosis not present

## 2016-03-04 MED ORDER — MEDROXYPROGESTERONE ACETATE 150 MG/ML IM SUSP
150.0000 mg | INTRAMUSCULAR | Status: DC
  Administered 2016-03-04: 150 mg via INTRAMUSCULAR

## 2016-03-04 NOTE — Progress Notes (Signed)
Patient was in office for  Medroxy Progesterone injection. 150 mg was given LUOQ patient did state that she has been bleeding almost everyday since she started getting the injection 3 months ago.. Patient scheduled appointment to come back in 3 months for next injection. Deyna Carbon,CMA

## 2016-03-10 ENCOUNTER — Ambulatory Visit (INDEPENDENT_AMBULATORY_CARE_PROVIDER_SITE_OTHER): Payer: BLUE CROSS/BLUE SHIELD | Admitting: Sports Medicine

## 2016-03-10 ENCOUNTER — Encounter: Payer: Self-pay | Admitting: Sports Medicine

## 2016-03-10 VITALS — BP 117/74 | HR 87 | Resp 16 | Wt 110.4 lb

## 2016-03-10 DIAGNOSIS — S6992XD Unspecified injury of left wrist, hand and finger(s), subsequent encounter: Secondary | ICD-10-CM

## 2016-03-10 NOTE — Progress Notes (Signed)
  Subjective:    CC: Followup  HPI: Patient returns, At the last visit we did a CT looking for scaphoid injury, there was simply posttraumatic tendinitis in the first extensor compartment, she's been in a thumb spica brace since then and overall is doing significantly better.  Past medical history, Surgical history, Family history not pertinant except as noted below, Social history, Allergies, and medications have been entered into the medical record, reviewed, and no changes needed.   Review of Systems: No fevers, chills, night sweats, weight loss, chest pain, or shortness of breath.   Objective:    General: Well Developed, well nourished, and in no acute distress.  Neuro: Alert and oriented x3, extra-ocular muscles intact, sensation grossly intact.  HEENT: Normocephalic, atraumatic, pupils equal round reactive to light, neck supple, no masses, no lymphadenopathy, thyroid nonpalpable.  Skin: Warm and dry, no rashes. Cardiac: Regular rate and rhythm, no murmurs rubs or gallops, no lower extremity edema.  Respiratory: Clear to auscultation bilaterally. Not using accessory muscles, speaking in full sentences. Left Wrist: Inspection normal with no visible erythema or swelling. ROM smooth and normal with good flexion and extension and ulnar/radial deviation that is symmetrical with opposite wrist. Palpation is normal over metacarpals, navicular, lunate, and TFCC; tendons without tenderness/ swelling No snuffbox tenderness. No tenderness over Canal of [place]. Strength 5/5 in all directions without pain. Minimally positive Finkelstein sign Negative Watson's test.  Impression and Recommendations:

## 2016-03-10 NOTE — Assessment & Plan Note (Signed)
CT showed atraumatic first extensor compartment tendinitis. Improving significantly, de Quervain's exercises given, may intermittently come out of the brace. Return if no better in one month, we will do a first extensor compartment injection.

## 2016-05-27 ENCOUNTER — Ambulatory Visit: Payer: BLUE CROSS/BLUE SHIELD

## 2016-06-01 ENCOUNTER — Ambulatory Visit: Payer: BLUE CROSS/BLUE SHIELD

## 2016-06-03 ENCOUNTER — Ambulatory Visit (INDEPENDENT_AMBULATORY_CARE_PROVIDER_SITE_OTHER): Payer: Self-pay | Admitting: Sports Medicine

## 2016-06-03 ENCOUNTER — Telehealth: Payer: Self-pay | Admitting: *Deleted

## 2016-06-03 VITALS — BP 130/83 | HR 87 | Wt 112.0 lb

## 2016-06-03 DIAGNOSIS — Z3042 Encounter for surveillance of injectable contraceptive: Secondary | ICD-10-CM

## 2016-06-03 MED ORDER — MEDROXYPROGESTERONE ACETATE 150 MG/ML IM SUSP
150.0000 mg | Freq: Once | INTRAMUSCULAR | Status: AC
  Administered 2016-06-03: 150 mg via INTRAMUSCULAR

## 2016-06-03 NOTE — Telephone Encounter (Signed)
Urine pregnancy test but also may give Depoprovera

## 2016-06-03 NOTE — Telephone Encounter (Signed)
Patient reports bleeding ever since she started the depo shot( spotting is normal of course but patient specifically states its more heavy than simple spotting) she is due to come in this am for 3 depo shot. Please advise

## 2016-06-03 NOTE — Progress Notes (Signed)
Patient is here for Depo Provera injection. She states she has had some bleeding ever since she started the injection. She denies cramping, headaches, back pain.   Advised patient that breakthrough bleeding is common. Per Dr. Benjamin Stainhekkekandam instructed patient that we should do a pregnancy test today. The patient states she does not have insurance and wants to know if the test is absolutely necessary. She has not had sex in six months. Spoke with Dr. Benjamin Stainhekkekandam and per his advise instructed patient to obtain pregnancy test from the pharmacy and call us with the results. Gave patient calender with window in which to schedule next appt. She has an upcoming appointment in October with her GYN and will decide if she wants to start OCP's then.

## 2016-06-16 ENCOUNTER — Telehealth: Payer: Self-pay | Admitting: Sports Medicine

## 2016-06-16 DIAGNOSIS — G43809 Other migraine, not intractable, without status migrainosus: Secondary | ICD-10-CM

## 2016-06-16 DIAGNOSIS — G43909 Migraine, unspecified, not intractable, without status migrainosus: Secondary | ICD-10-CM | POA: Insufficient documentation

## 2016-06-16 MED ORDER — RIZATRIPTAN BENZOATE 10 MG PO TBDP: 10.0000 mg | ORAL_TABLET | ORAL | 3 refills | Status: DC | PRN

## 2016-06-16 NOTE — Assessment & Plan Note (Signed)
Adding Maxalt

## 2016-06-16 NOTE — Telephone Encounter (Signed)
Patient called clinic stating the following: 1. She has had migraines off and on since last Depo shot. Pt reports they began the day after getting injection and last 2-3 days at a time. 2. Pt reports she has had constant nausea since last injection. Pt states it feels like "morning sickness" but last all day. 3. Pt is still bleeding. Reports it is "getting better" but not stopped. 4. Pt has taken home pregnancy test, all negative.  Questions if these symptoms are "normal" and will go away. Will route to PCP for review and recommendation.

## 2016-06-16 NOTE — Telephone Encounter (Signed)
Sounds like migraine, calling in Maxalt

## 2016-06-16 NOTE — Telephone Encounter (Signed)
Pt advised of new Rx and recommendation. States she has some left over Zofran 4mg  from when she had a stomach virus last year. Questions if OK to take. I advised Pt as long as Rx is in date, and she didn't have negative side effects last time she took it, then it was OK to take. Pt will try new Rx and Zofran. If no relief, she is advised to contact clinic.  Pt also questioned if she could get an antibiotic for a possible UTI. Advised she would have to come in for a visit regarding that concern. States she will call clinic to set up a time to come in. No further questions at this time.

## 2016-06-30 ENCOUNTER — Ambulatory Visit (INDEPENDENT_AMBULATORY_CARE_PROVIDER_SITE_OTHER): Payer: BLUE CROSS/BLUE SHIELD | Admitting: Sports Medicine

## 2016-06-30 ENCOUNTER — Encounter: Payer: Self-pay | Admitting: Sports Medicine

## 2016-06-30 DIAGNOSIS — Z3009 Encounter for other general counseling and advice on contraception: Secondary | ICD-10-CM

## 2016-06-30 DIAGNOSIS — F411 Generalized anxiety disorder: Secondary | ICD-10-CM

## 2016-06-30 DIAGNOSIS — G43809 Other migraine, not intractable, without status migrainosus: Secondary | ICD-10-CM

## 2016-06-30 MED ORDER — CEPHALEXIN 500 MG PO CAPS
500.0000 mg | ORAL_CAPSULE | Freq: Two times a day (BID) | ORAL | 0 refills | Status: DC
Start: 2016-06-30 — End: 2018-05-24

## 2016-06-30 MED ORDER — ESTRADIOL 2 MG PO TABS: 2.0000 mg | ORAL_TABLET | Freq: Every day | ORAL | 0 refills | Status: DC

## 2016-06-30 MED ORDER — ONDANSETRON 8 MG PO TBDP: 8.0000 mg | ORAL_TABLET | Freq: Three times a day (TID) | ORAL | 3 refills | Status: DC | PRN

## 2016-06-30 MED ORDER — ALPRAZOLAM 0.5 MG PO TABS: 0.5000 mg | ORAL_TABLET | Freq: Every day | ORAL | 3 refills | Status: DC | PRN

## 2016-06-30 NOTE — Assessment & Plan Note (Addendum)
Unfortunately continues to have intermittent bleeding, she has been on Depo-provera for months and months and months. Recent shot was last month. Her honeymoon is coming up soon, and I would like to help her stop bleeding, I did discuss this with OB/GYN, they recommended Estrace 2 mg daily for 2 weeks.

## 2016-06-30 NOTE — Assessment & Plan Note (Signed)
Has not yet taken the Maxalt.

## 2016-06-30 NOTE — Assessment & Plan Note (Signed)
Adding low-dose alprazolam, she gets married in a week

## 2016-06-30 NOTE — Progress Notes (Signed)
  Subjective:    CC: anxiety, headaches  HPI: 30 yo with history of GAD, migraines presenting with three weeks of anxiety, migraines, and nausea.  Patient says she has been extremely stressed out recently because she is getting married next week and lost her job last month.  She complains of migraine headaches that occur every few days, associated with nausea, photophobia, and phonophobia.  She describes headaches as throbbing and localized primarily at the base of her head and neck.  Excedrin Migraine usually helps with the pain.  She was called in a prescription for Maxalt but has been unable to fill it because her insurance coverage lapsed, although she is being issued a new card later this week.  She said her last migraine was over the weekend.  She also states she has nausea when she has migraines, as takes Zofran to help with the nausea.    Of greater concern to her is anxiety, which she attributes to wedding stress.  She says migraines often accompany her anxiety.  She has been taking a half tab of her mother's 5mg  Xanax when she gets anxious, with good relief of symptoms.  Patient does have a history of anxiety but has never been on medication, although she is interested in talking about medical management of anxiety at this time.  She is interested in getting Xanax to take during this period of time when she is stressed out with wedding planning.      Lastly, she complains of ongoing vaginal bleeding since receiving a Depo shot earlier this month.  She has taken an at-home-pregnancy test that was negative.  She has an OB visit scheduled at the end of the month to address this problem, but is wondering if there is anything she can do in the meantime to stop the bleeding, as she will be on her honeymoon in two weeks.   Past medical history, Surgical history, Family history not pertinant except as noted below, Social history, Allergies, and medications have been entered into the medical record,  reviewed, and no changes needed.   Review of Systems: No fevers, chills, night sweats, weight loss, chest pain, or shortness of breath.   Objective:    General: Well Developed, well nourished, and in no acute distress.  Neuro: Alert and oriented x3, extra-ocular muscles intact, sensation grossly intact.  HEENT: Normocephalic, atraumatic, pupils equal round reactive to light, neck supple, no masses, no lymphadenopathy, thyroid nonpalpable.  Skin: Warm and dry, no rashes. Cardiac: Regular rate and rhythm, no murmurs rubs or gallops, no lower extremity edema.  Respiratory: Clear to auscultation bilaterally. Not using accessory muscles, speaking in full sentences.   Impression and Recommendations:    1. Migraines: unable to fill Maxalt because insurance has lapsed.  Getting new insurance card later this week and will be able to fill script. -Maxalt PRN for migraines -Zofran PRN for nausea   2. GAD: likely exacerbated in setting of upcoming wedding  -Xanax 0.5 mg as needed for anxiety   3. Vaginal bleeding -Will follow up with OB for management (appt scheduled later this month)  -With upcoming honeymoon, will prescribe 2 weeks of estriol to stop bleeding prior to honeymoon

## 2016-08-19 ENCOUNTER — Ambulatory Visit: Payer: BLUE CROSS/BLUE SHIELD

## 2016-12-28 ENCOUNTER — Other Ambulatory Visit: Payer: Self-pay | Admitting: Sports Medicine

## 2017-05-03 ENCOUNTER — Other Ambulatory Visit: Payer: Self-pay | Admitting: Sports Medicine
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
## 2018-05-24 ENCOUNTER — Ambulatory Visit (INDEPENDENT_AMBULATORY_CARE_PROVIDER_SITE_OTHER): Payer: PRIVATE HEALTH INSURANCE | Admitting: Physician Assistant

## 2018-05-24 ENCOUNTER — Encounter: Payer: Self-pay | Admitting: Physician Assistant

## 2018-05-24 VITALS — BP 117/82 | HR 69 | Temp 97.9°F | Ht 65.0 in | Wt 120.0 lb

## 2018-05-24 DIAGNOSIS — R197 Diarrhea, unspecified: Secondary | ICD-10-CM

## 2018-05-24 DIAGNOSIS — R10817 Generalized abdominal tenderness: Secondary | ICD-10-CM

## 2018-05-24 LAB — CBC WITH DIFFERENTIAL/PLATELET
BASOS PCT: 0.7 %
Basophils Absolute: 50 cells/uL (ref 0–200)
Eosinophils Absolute: 79 cells/uL (ref 15–500)
Eosinophils Relative: 1.1 %
HEMATOCRIT: 45 % (ref 35.0–45.0)
Hemoglobin: 15.5 g/dL (ref 11.7–15.5)
Lymphs Abs: 1678 cells/uL (ref 850–3900)
MCH: 32.7 pg (ref 27.0–33.0)
MCHC: 34.4 g/dL (ref 32.0–36.0)
MCV: 94.9 fL (ref 80.0–100.0)
MPV: 10.3 fL (ref 7.5–12.5)
Monocytes Relative: 7.1 %
NEUTROS ABS: 4882 {cells}/uL (ref 1500–7800)
Neutrophils Relative %: 67.8 %
Platelets: 205 10*3/uL (ref 140–400)
RBC: 4.74 10*6/uL (ref 3.80–5.10)
RDW: 11.9 % (ref 11.0–15.0)
Total Lymphocyte: 23.3 %
WBC mixed population: 511 cells/uL (ref 200–950)
WBC: 7.2 10*3/uL (ref 3.8–10.8)

## 2018-05-24 LAB — COMPLETE METABOLIC PANEL WITH GFR
AG Ratio: 1.9 (calc) (ref 1.0–2.5)
ALBUMIN MSPROF: 4.5 g/dL (ref 3.6–5.1)
ALT: 13 U/L (ref 6–29)
AST: 15 U/L (ref 10–30)
Alkaline phosphatase (APISO): 56 U/L (ref 33–115)
BUN: 11 mg/dL (ref 7–25)
CALCIUM: 9.5 mg/dL (ref 8.6–10.2)
CO2: 26 mmol/L (ref 20–32)
CREATININE: 0.81 mg/dL (ref 0.50–1.10)
Chloride: 103 mmol/L (ref 98–110)
GFR, EST AFRICAN AMERICAN: 111 mL/min/{1.73_m2} (ref >=60)
GFR, Est Non African American: 96 mL/min/{1.73_m2} (ref >=60)
GLUCOSE: 83 mg/dL (ref 65–99)
Globulin: 2.4 g/dL (calc) (ref 1.9–3.7)
Potassium: 4.2 mmol/L (ref 3.5–5.3)
Sodium: 140 mmol/L (ref 135–146)
TOTAL PROTEIN: 6.9 g/dL (ref 6.1–8.1)
Total Bilirubin: 0.6 mg/dL (ref 0.2–1.2)
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
## 2018-05-24 NOTE — Patient Instructions (Signed)
Diarrhea, Adult °Diarrhea is when you have loose and water poop (stool) often. Diarrhea can make you feel weak and cause you to get dehydrated. Dehydration can make you tired and thirsty, make you have a dry mouth, and make it so you pee (urinate) less often. Diarrhea often lasts 2-3 days. However, it can last longer if it is a sign of something more serious. It is important to treat your diarrhea as told by your doctor. °Follow these instructions at home: °Eating and drinking ° °Follow these recommendations as told by your doctor: °· Take an oral rehydration solution (ORS). This is a drink that is sold at pharmacies and stores. °· Drink clear fluids, such as: °? Water. °? Ice chips. °? Diluted fruit juice. °? Low-calorie sports drinks. °· Eat bland, easy-to-digest foods in small amounts as you are able. These foods include: °? Bananas. °? Applesauce. °? Rice. °? Low-fat (lean) meats. °? Toast. °? Crackers. °· Avoid drinking fluids that have a lot of sugar or caffeine in them. °· Avoid alcohol. °· Avoid spicy or fatty foods. ° °General instructions ° °· Drink enough fluid to keep your pee (urine) clear or pale yellow. °· Wash your hands often. If you cannot use soap and water, use hand sanitizer. °· Make sure that all people in your home wash their hands well and often. °· Take over-the-counter and prescription medicines only as told by your doctor. °· Rest at home while you get better. °· Watch your condition for any changes. °· Take a warm bath to help with any burning or pain from having diarrhea. °· Keep all follow-up visits as told by your doctor. This is important. °Contact a doctor if: °· You have a fever. °· Your diarrhea gets worse. °· You have new symptoms. °· You cannot keep fluids down. °· You feel light-headed or dizzy. °· You have a headache. °· You have muscle cramps. °Get help right away if: °· You have chest pain. °· You feel very weak or you pass out (faint). °· You have bloody or black poop or  poop that look like tar. °· You have very bad pain, cramping, or bloating in your belly (abdomen). °· You have trouble breathing or you are breathing very quickly. °· Your heart is beating very quickly. °· Your skin feels cold and clammy. °· You feel confused. °· You have signs of dehydration, such as: °? Dark pee, hardly any pee, or no pee. °? Cracked lips. °? Dry mouth. °? Sunken eyes. °? Sleepiness. °? Weakness. °This information is not intended to replace advice given to you by your health care provider. Make sure you discuss any questions you have with your health care provider. °Document Released: 03/30/2008 Document Revised: 05/01/2016 Document Reviewed: 06/18/2015 °Elsevier Interactive Patient Education © 2018 [employer] ° °

## 2018-05-25 ENCOUNTER — Telehealth: Payer: Self-pay

## 2018-05-25 MED ORDER — CIPROFLOXACIN HCL 500 MG PO TABS: 500.0000 mg | ORAL_TABLET | Freq: Two times a day (BID) | ORAL | 0 refills | Status: DC

## 2018-05-25 NOTE — Telephone Encounter (Signed)
Pt advised. She states she is hesitant on taking another med will have it on hand in case she feels she needs it.   Pt understand signs and SX that would indicate her needing to go to ED for re-evaluation.   Gave pt number to [facility] because she had some questions concerning how much stool was necessary for her samples.  No other needs at this time.

## 2018-05-25 NOTE — Telephone Encounter (Signed)
Spoke with pt- discussed lab results with her. Pt states she did not turn in the stool yesterday because she could not go, but she will be turning them in today to lab.  Pt reports she is still having pain but it has decreased from every 15 minutes to about every hour. Reports that she did get up a couple times last night to go to bathroom but all that came out was "mucousy blood". Pt states bleeding has gotten worse but no there new or worsening SX.  Pt wants to know how long before results for stool after turning in samples? Also wanting to know what kind of changes or SX she should look out for to know whether she needs to go to ER to get re-evaluated.   Please advise

## 2018-05-25 NOTE — Progress Notes (Signed)
Call pt: WBC normal. Kidney, liver, glucose look great. How are your symptoms this morning?

## 2018-05-25 NOTE — Telephone Encounter (Signed)
Ok I sent antibiotic over for infectious diarrhea. I still want stool cultures but start antibiotic as soon as you return cultures. If her HR increasing above 120 at rest, feeling really weak, every stool appears full of blood I would go to ED.

## 2018-05-26 NOTE — Progress Notes (Signed)
Call pt: other stool cultures pending but no parasites found in stool.

## 2018-05-27 ENCOUNTER — Telehealth: Payer: Self-pay | Admitting: Osteopathic Medicine

## 2018-05-27 NOTE — Telephone Encounter (Signed)
On-call provider note: Received call re: (+)shiga toxin on stool studies.  Looks like Alice Johnston had already received prelim results 05/24/18  Renal function was ok at that time, no concern for HUS

## 2018-05-27 NOTE — Progress Notes (Signed)
FYI LM for patient with preliminary results of shiga toxin positive. cipro should treat. Find out on Monday how patient is doing?

## 2018-05-29 LAB — STOOL CULTURE
MICRO NUMBER: 90907849
MICRO NUMBER:: 90907847
MICRO NUMBER:: 90907848
SPECIMEN QUALITY:: ADEQUATE
SPECIMEN QUALITY:: ADEQUATE
SPECIMEN QUALITY:: ADEQUATE

## 2018-05-29 LAB — E COLI O157 CULTURE
MICRO NUMBER: 90912879
SPECIMEN QUALITY:: ADEQUATE

## 2018-05-29 LAB — OVA AND PARASITE EXAMINATION
CONCENTRATE RESULT: NONE SEEN
SPECIMEN QUALITY:: ADEQUATE
TRICHROME RESULT:: NONE SEEN
VKL: 90906561

## 2018-05-29 LAB — CLOSTRIDIUM DIFFICILE CULTURE-FECAL

## 2018-05-29 NOTE — Progress Notes (Signed)
No C diff

## 2018-05-29 NOTE — Progress Notes (Signed)
Final result positive for shiga toxin only. cipro should treat.

## 2018-05-31 ENCOUNTER — Telehealth: Payer: Self-pay

## 2018-05-31 NOTE — Telephone Encounter (Signed)
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  Stop hyoscyamine and Viberzi for now until stooling normally.  Call back if normal stooling does not improve after a week.

## 2018-05-31 NOTE — Telephone Encounter (Signed)
Alice Johnston had diarrhea last week due to being positive for shiga toxin. This week she hasn't had a BM for 6 days. She has IBS and normally has a BM once daily. She does have some cramping after eating. Denies fever, chills or sweats. Please advise.

## 2018-05-31 NOTE — Telephone Encounter (Signed)
Patient advised of recommendations.  

## 2018-08-02 ENCOUNTER — Ambulatory Visit (INDEPENDENT_AMBULATORY_CARE_PROVIDER_SITE_OTHER): Payer: PRIVATE HEALTH INSURANCE | Admitting: Sports Medicine

## 2018-08-02 DIAGNOSIS — F411 Generalized anxiety disorder: Secondary | ICD-10-CM

## 2018-08-02 DIAGNOSIS — K580 Irritable bowel syndrome with diarrhea: Secondary | ICD-10-CM | POA: Diagnosis not present

## 2018-08-02 DIAGNOSIS — Z0000 Encounter for general adult medical examination without abnormal findings: Secondary | ICD-10-CM

## 2018-08-02 MED ORDER — HYOSCYAMINE SULFATE ER 0.375 MG PO TB12: ORAL_TABLET | ORAL | 0 refills | Status: DC

## 2018-08-02 MED ORDER — ONDANSETRON 8 MG PO TBDP: 8.0000 mg | ORAL_TABLET | Freq: Three times a day (TID) | ORAL | 3 refills | Status: DC | PRN

## 2018-08-02 MED ORDER — ALPRAZOLAM 0.5 MG PO TABS: 0.5000 mg | ORAL_TABLET | Freq: Every day | ORAL | 3 refills | Status: DC | PRN

## 2018-08-02 NOTE — Progress Notes (Signed)
Subjective:    CC: Annual physical  HPI:  Alice Johnston returns, she is a pleasant 32 year old female, we have been treating her for what sounded to be diarrhea predominant IBS.  More recently she developed an episode of hematochezia with left lower quadrant cramping and pain.  Stool culture did come back with positive Shiga toxin, no specific microorganism identified.  She has had a normal CT abdomen and pelvis with contrast a few years back, but has never had an endoscopy.  Her anxiety is far better controlled.  Preventive measures: Declines flu and tetanus shots, she is agreeable to get routine labs done.  She has her cervical cancer screening scheduled later this week with her gynecologist.  I reviewed the past medical history, family history, social history, surgical history, and allergies today and no changes were needed.  Please see the problem list section below in epic for further details.  Past Medical History: No past medical history on file. Past Surgical History: No past surgical history on file. Social History: Social History   Socioeconomic History  . Marital status: Single    Spouse name: Not on file  . Number of children: Not on file  . Years of education: Not on file  . Highest education level: Not on file  Occupational History  . Not on file  Social Needs  . Financial resource strain: Not on file  . Food insecurity:    Worry: Not on file    Inability: Not on file  . Transportation needs:    Medical: Not on file    Non-medical: Not on file  Tobacco Use  . Smoking status: Never Smoker  . Smokeless tobacco: Never Used  Substance and Sexual Activity  . Alcohol use: Yes  . Drug use: No  . Sexual activity: Yes  Lifestyle  . Physical activity:    Days per week: Not on file    Minutes per session: Not on file  . Stress: Not on file  Relationships  . Social connections:    Talks on phone: Not on file    Gets together: Not on file    Attends religious service:  Not on file    Active member of club or organization: Not on file    Attends meetings of clubs or organizations: Not on file    Relationship status: Not on file  Other Topics Concern  . Not on file  Social History Narrative  . Not on file   Family History: Family History  Problem Relation Age of Onset  . Depression Mother   . Depression Father   . Diabetes Father   . Hypertension Father   . Stroke Father   . Cancer Maternal Uncle   . Heart attack Paternal Uncle   . Cancer Paternal Grandfather   . Heart attack Paternal Grandfather   . Hypertension Paternal Grandfather    Allergies: No Known Allergies Medications: See med rec.  Review of Systems: No headache, visual changes, nausea, vomiting, diarrhea, constipation, dizziness, abdominal pain, skin rash, fevers, chills, night sweats, swollen lymph nodes, weight loss, chest pain, body aches, joint swelling, muscle aches, shortness of breath, mood changes, visual or auditory hallucinations.  Objective:    General: Well Developed, well nourished, and in no acute distress.  Neuro: Alert and oriented x3, extra-ocular muscles intact, sensation grossly intact. Cranial nerves II through XII are intact, motor, sensory, and coordinative functions are all intact. HEENT: Normocephalic, atraumatic, pupils equal round reactive to light, neck supple, no masses, no lymphadenopathy,  thyroid nonpalpable. Oropharynx, nasopharynx, external ear canals are unremarkable. Skin: Warm and dry, no rashes noted.  Skin tag on the left upper shoulder anteriorly. Cardiac: Regular rate and rhythm, no murmurs rubs or gallops.  Respiratory: Clear to auscultation bilaterally. Not using accessory muscles, speaking in full sentences.  Abdominal: Soft, nontender, nondistended, positive bowel sounds, no masses, no organomegaly.  Musculoskeletal: Shoulder, elbow, wrist, hip, knee, ankle stable, and with full range of motion.  Impression and Recommendations:    The  patient was counselled, risk factors were discussed, anticipatory guidance given.  Annual physical exam Annual physical as above, due for routine labs. Declines flu and Tdap. Cervical cancer screening scheduled with her OB/GYN for next week..   Irritable bowel syndrome Has historically been well controlled on Levbid, occasional Xanax. Refilling these. She did have an episode of hematochezia, stool culture did come back with positive Shiga toxin, there was no evidence of E. coli, no Shigella, no Salmonella, no capital bacteriosis, no C. Difficile. Because of her diarrhea predominant type IBS symptoms but now with this episode of hematochezia with a negative CT abdomen pelvis with oral and IV contrast several years back I would like her to touch base with gastroenterology. I would suspect she would need a colonoscopy possibly with biopsies to fully rule out colitis.  Generalized anxiety disorder Overall well controlled, refilling alprazolam.  ___________________________________________ Ihor Austin. Benjamin Stain, M.D., ABFM., CAQSM. Primary Care and Sports Medicine [facility]  Adjunct Instructor of Family Medicine  [employer]

## 2018-08-02 NOTE — Assessment & Plan Note (Signed)
Has historically been well controlled on Levbid, occasional Xanax. Refilling these. She did have an episode of hematochezia, stool culture did come back with positive Shiga toxin, there was no evidence of E. coli, no Shigella, no Salmonella, no capital bacteriosis, no C. Difficile. Because of her diarrhea predominant type IBS symptoms but now with this episode of hematochezia with a negative CT abdomen pelvis with oral and IV contrast several years back I would like her to touch base with gastroenterology. I would suspect she would need a colonoscopy possibly with biopsies to fully rule out colitis.

## 2018-08-02 NOTE — Assessment & Plan Note (Addendum)
Annual physical as above, due for routine labs. Declines flu and Tdap. Cervical cancer screening scheduled with her OB/GYN for next week.Marland Kitchen

## 2018-08-02 NOTE — Assessment & Plan Note (Signed)
Overall well controlled, refilling alprazolam.

## 2018-08-11 LAB — HM PAP SMEAR: HM Pap smear: NEGATIVE

## 2018-08-18 ENCOUNTER — Encounter: Payer: Self-pay | Admitting: Gastroenterology

## 2018-08-24 ENCOUNTER — Ambulatory Visit (INDEPENDENT_AMBULATORY_CARE_PROVIDER_SITE_OTHER): Payer: PRIVATE HEALTH INSURANCE | Admitting: Gastroenterology

## 2018-08-24 ENCOUNTER — Encounter: Payer: Self-pay | Admitting: Gastroenterology

## 2018-08-24 VITALS — BP 120/78 | HR 72 | Ht 65.0 in | Wt 126.4 lb

## 2018-08-24 DIAGNOSIS — K580 Irritable bowel syndrome with diarrhea: Secondary | ICD-10-CM | POA: Diagnosis not present

## 2018-08-24 DIAGNOSIS — R140 Abdominal distension (gaseous): Secondary | ICD-10-CM | POA: Diagnosis not present

## 2018-08-24 NOTE — Patient Instructions (Addendum)
Fiber Chart  You should be consuming 25-30g of fiber per day and drinking 8 glasses of water to help your bowels move regularly.  In the chart below you can look up how much fiber you are getting in an average day.  If you are not getting enough fiber, you should add a fiber supplement to your diet.  Examples of this include Metamucil, FiberCon and Citrucel.  These can be purchased at your local grocery store or pharmacy.      [email or web address]  Low FODMAP Diet: (Fermentable Oligosaccharides, Disaccharides, Monosaccharides, and Polyols) These are short chain carbohydrates and sugar alcohols that are poorly absorbed by the body, resulting in multiple abdominal symptoms, including changes in bowel habits, abdominal pain/discomfort, bloating, abdominal distension, gas, etc.      Trial fiber supplement. Recommend Citrucel or BeneFiber (or generic equivalent).   Please start taking citrucel (orange flavored) powder fiber supplement.  This may cause some bloating at first but that usually goes away. Begin with a small spoonful and work your way up to a large, heaping spoonful daily over a week.   It was a pleasure to see you today!  Mindie Rawdon, D.O.

## 2018-08-24 NOTE — Progress Notes (Signed)
Chief Complaint: Abdominal pain, diarrhea   Referring Provider:     Monica Becton, MD    HPI:     Alice Johnston is a 32 y.o. female with a history of recently diagnosed IBS-D referred to the [facility] for evaluation of IBS and hematochezia.  She was evaluated by her [facility] for the symptoms in July and evaluation at that time notable for stool culture positive for Shiga toxin-treated with Cipro with overall clinical improvement.  Otherwise negative E. coli, negative ova and parasite, negative stool culture, negative C. difficile, and normal CBC and CMP.  She was seen in follow-up by her PCM earlier this month and labs ordered but not yet done.  Given ongoing symptoms and hematochezia, was referred to GI for further evaluation.  Today she states sxs have been present for multiple years, starting as diarrhea, worse with poor diet at that time (fast food, etc). Approx 5 years ago or so with abdmonal cramping, with associated diarrhea, gas, bloating. Evaluated with pelvic US last week (negative per patient) and CT in 2015 (Normal). Eventually diagnosed with IBS-D earlier this year. Started hyosyamine with improvement in sxs (bloating and pain), but still with diarrhea. Took 2-3 times/week with prompt clinical improvement.  Then presented with hematochezia in July prompting stool cultures which were notable for positive sugar toxin as above, treated with Cipro.  Hematochezia has since resolved and not recurred.  Overall, since given her diagnosis of IBS-D and reading, lifestyle changes, dietary changes, she feels overall much improved.  She recognizes symptoms related to dietary indiscretion as well as period of stress/anxiety- Sxs worse when caring for dying father 3 years ago.  Now, she states has only taken prn meds (Levbid and previously Levsin) a few times over the last month due to dietary mods, avoiding fast foods, etc. Has noticed sxs improved since intentionally  increased wt by 15# (126 now) as well.    History reviewed. No pertinent past medical history.   History reviewed. No pertinent surgical history. Family History  Problem Relation Age of Onset  . Depression Mother   . Depression Father   . Diabetes Father   . Hypertension Father   . Stroke Father   . Cancer Maternal Uncle   . Heart attack Paternal Uncle   . Cancer Paternal Grandfather   . Heart attack Paternal Grandfather   . Hypertension Paternal Grandfather    Social History   Tobacco Use  . Smoking status: Never Smoker  . Smokeless tobacco: Never Used  Substance Use Topics  . Alcohol use: Yes  . Drug use: No   Current Outpatient Medications  Medication Sig Dispense Refill  . ALPRAZolam (XANAX) 0.5 MG tablet Take 1 tablet (0.5 mg total) by mouth daily as needed for anxiety. 30 tablet 3  . hyoscyamine (LEVBID) 0.375 MG 12 hr tablet TAKE 1 TABLET (0.375 MG TOTAL) BY MOUTH DAILY. 90 tablet 0  . ondansetron (ZOFRAN-ODT) 8 MG disintegrating tablet Take 1 tablet (8 mg total) by mouth every 8 (eight) hours as needed for nausea. 20 tablet 3   No current facility-administered medications for this visit.    No Known Allergies   Review of Systems: All systems reviewed and negative except where noted in HPI.     Physical Exam:    Wt Readings from Last 3 Encounters:  08/24/18 126 lb 6 oz (57.3 kg)  08/02/18 126 lb (57.2 kg)  05/24/18 120  lb (54.4 kg)    BP 120/78   Pulse 72   Ht 5\' 5"  (1.651 m)   Wt 126 lb 6 oz (57.3 kg)   BMI 21.03 kg/m  Constitutional:  Pleasant, in no acute distress. Psychiatric: Normal mood and affect. Behavior is normal. EENT: Pupils normal.  Conjunctivae are normal. No scleral icterus. Neck supple. No cervical LAD. Cardiovascular: Normal rate, regular rhythm. No edema Pulmonary/chest: Effort normal and breath sounds normal. No wheezing, rales or rhonchi. Abdominal: Soft, nondistended, nontender. Bowel sounds active throughout. There are no  masses palpable. No hepatomegaly. Neurological: Alert and oriented to person place and time. Skin: Skin is warm and dry. No rashes noted.   ASSESSMENT AND PLAN;   Shaia Porath is a 32 y.o. 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  Discussed the pathophysiology of IBS at length along with dietary/lifestyle modifications and medical management options and will proceed as below:  - Provided handout on low FODMAP diet with detailed explanation - Ensure intake of at least 8 glasses of 8 ounces of fluid/water per day - Okay to take Imodium as needed, particularly with situational exacerbation of symptoms.  Depending on response to therapy and ongoing need for medical intervention, can consider trial of Motofen  - Encouraged to keep a diet diary - Increase dietary fiber and add fiber supplement (i.e. Citrucel, Benefiber) for goal of 25 g of fiber per day with goal for soft, formed stools without straining to have a BM - Okay to resume PRN Levbid - Discussed neuromodulation with TCA, SSRI.  Given infrequent use of PRN med, do not feel this is necessary at this time - Discussed the medical benefits of yoga and regular exercise in all IBS patients - Discussed the medical benefit of probiotics, particularly in patients with bloating and flatulence - Briefly discussed eluxadoline for use and IBS-D; cannot be used in patients with liver or pancreatic issues, EtOH, or s/p CCY - If needed, can consider trial of glutamine 5 g 3 times daily in IBS patients - We discussed the indications of colonoscopy at length to evaluate for alternate mucosal or luminal etiology to include biopsies to evaluate for Microscopic Colitis.  Given her overall clinical improvement and less few months, along with complete resolution of hematochezia, she elected to defer colonoscopy at this time in favor of above  intervention.  I feel this is reasonable - RTC in 3 to 6 months or sooner as needed  2) Hematochezia: Completely resolved after Cipro following positive Shiga toxin with no recurrence.  As above, discussed colonoscopy to evaluate for alternate etiology, polyps, etc., and she elected to defer at this time.  If recurrence, encouraged her to call the office and can schedule directly for colonoscopy.  3) Bloating: Secondary to underlying IBS and will treat as above.  I spent a total of 45 minutes of face-to-face time with the patient. Greater than 50% of the time was spent counseling and coordinating care.   Shellia Cleverly, DO, FACG  08/24/2018, 11:52 AM   Monica Becton,*

## 2018-10-23 ENCOUNTER — Other Ambulatory Visit: Payer: Self-pay | Admitting: Sports Medicine

## 2018-10-23 DIAGNOSIS — K580 Irritable bowel syndrome with diarrhea: Secondary | ICD-10-CM

## 2018-10-27 ENCOUNTER — Encounter: Payer: Self-pay | Admitting: Sports Medicine

## 2019-07-31 ENCOUNTER — Other Ambulatory Visit: Payer: Self-pay | Admitting: Sports Medicine

## 2019-07-31 DIAGNOSIS — F411 Generalized anxiety disorder: Secondary | ICD-10-CM

## 2019-07-31 MED ORDER — ALPRAZOLAM 0.5 MG PO TABS: 0.5000 mg | ORAL_TABLET | Freq: Every day | ORAL | 3 refills | Status: DC | PRN

## 2019-11-05 DIAGNOSIS — Z20828 Contact with and (suspected) exposure to other viral communicable diseases: Secondary | ICD-10-CM | POA: Diagnosis not present

## 2019-11-28 DIAGNOSIS — Z20828 Contact with and (suspected) exposure to other viral communicable diseases: Secondary | ICD-10-CM | POA: Diagnosis not present

## 2020-04-03 ENCOUNTER — Encounter: Payer: Self-pay | Admitting: Sports Medicine

## 2020-04-03 ENCOUNTER — Other Ambulatory Visit: Payer: Self-pay

## 2020-04-03 ENCOUNTER — Ambulatory Visit (INDEPENDENT_AMBULATORY_CARE_PROVIDER_SITE_OTHER): Payer: BC Managed Care – PPO | Admitting: Sports Medicine

## 2020-04-03 VITALS — BP 121/68 | HR 71 | Ht 65.0 in | Wt 130.0 lb

## 2020-04-03 DIAGNOSIS — K580 Irritable bowel syndrome with diarrhea: Secondary | ICD-10-CM

## 2020-04-03 DIAGNOSIS — F411 Generalized anxiety disorder: Secondary | ICD-10-CM

## 2020-04-03 DIAGNOSIS — Z0000 Encounter for general adult medical examination without abnormal findings: Secondary | ICD-10-CM

## 2020-04-03 DIAGNOSIS — E559 Vitamin D deficiency, unspecified: Secondary | ICD-10-CM | POA: Diagnosis not present

## 2020-04-03 DIAGNOSIS — L918 Other hypertrophic disorders of the skin: Secondary | ICD-10-CM

## 2020-04-03 MED ORDER — ALPRAZOLAM 0.5 MG PO TABS: 0.5000 mg | ORAL_TABLET | Freq: Every day | ORAL | 3 refills | Status: DC | PRN

## 2020-04-03 MED ORDER — ONDANSETRON 8 MG PO TBDP: 8.0000 mg | ORAL_TABLET | Freq: Three times a day (TID) | ORAL | 3 refills | Status: DC | PRN

## 2020-04-03 MED ORDER — HYOSCYAMINE SULFATE ER 0.375 MG PO TB12: 0.3750 mg | ORAL_TABLET | Freq: Two times a day (BID) | ORAL | 11 refills | Status: DC | PRN

## 2020-04-03 NOTE — Assessment & Plan Note (Signed)
Annual physical as above. Ordering routine labs.  

## 2020-04-03 NOTE — Addendum Note (Signed)
Addended by: Monica Becton on: 04/03/2020 04:47 PM   Modules accepted: Orders

## 2020-04-03 NOTE — Assessment & Plan Note (Addendum)
Cryotherapy of a skin tag on the left upper back, left anterior shoulder and left neck.

## 2020-04-03 NOTE — Assessment & Plan Note (Signed)
Currently under excellent control, she did do an elimination diet and used FODMAP criteria, and things have improved considerably.

## 2020-04-03 NOTE — Progress Notes (Addendum)
Subjective:    CC: Annual Physical Exam  HPI:  This patient is here for their annual physical  I reviewed the past medical history, family history, social history, surgical history, and allergies today and no changes were needed.  Please see the problem list section below in epic for further details.  Past Medical History: No past medical history on file. Past Surgical History: No past surgical history on file. Social History: Social History   Socioeconomic History  . Marital status: Single    Spouse name: Not on file  . Number of children: Not on file  . Years of education: Not on file  . Highest education level: Not on file  Occupational History  . Not on file  Tobacco Use  . Smoking status: Never Smoker  . Smokeless tobacco: Never Used  Substance and Sexual Activity  . Alcohol use: Yes  . Drug use: No  . Sexual activity: Yes  Other Topics Concern  . Not on file  Social History Narrative  . Not on file   Social Determinants of Health   Financial Resource Strain:   . Difficulty of Paying Living Expenses:   Food Insecurity:   . Worried About Charity fundraiser in the Last Year:   . Arboriculturist in the Last Year:   Transportation Needs:   . Film/video editor (Medical):   Marland Kitchen Lack of Transportation (Non-Medical):   Physical Activity:   . Days of Exercise per Week:   . Minutes of Exercise per Session:   Stress:   . Feeling of Stress :   Social Connections:   . Frequency of Communication with Friends and Family:   . Frequency of Social Gatherings with Friends and Family:   . Attends Religious Services:   . Active Member of Clubs or Organizations:   . Attends Archivist Meetings:   Marland Kitchen Marital Status:    Family History: Family History  Problem Relation Age of Onset  . Depression Mother   . Depression Father   . Diabetes Father   . Hypertension Father   . Stroke Father   . Cancer Maternal Uncle   . Heart attack Paternal Uncle   . Cancer  Paternal Grandfather   . Heart attack Paternal Grandfather   . Hypertension Paternal Grandfather    Allergies: No Known Allergies Medications: See med rec.  Review of Systems: No headache, visual changes, nausea, vomiting, diarrhea, constipation, dizziness, abdominal pain, skin rash, fevers, chills, night sweats, swollen lymph nodes, weight loss, chest pain, body aches, joint swelling, muscle aches, shortness of breath, mood changes, visual or auditory hallucinations.  Objective:    General: Well Developed, well nourished, and in no acute distress.  Neuro: Alert and oriented x3, extra-ocular muscles intact, sensation grossly intact. Cranial nerves II through XII are intact, motor, sensory, and coordinative functions are all intact. HEENT: Normocephalic, atraumatic, pupils equal round reactive to light, neck supple, no masses, no lymphadenopathy, thyroid nonpalpable. Oropharynx, nasopharynx, external ear canals are unremarkable. Skin: Warm and dry, no rashes noted.  Cardiac: Regular rate and rhythm, no murmurs rubs or gallops.  Respiratory: Clear to auscultation bilaterally. Not using accessory muscles, speaking in full sentences.  Abdominal: Soft, nontender, nondistended, positive bowel sounds, no masses, no organomegaly.  Musculoskeletal: Shoulder, elbow, wrist, hip, knee, ankle stable, and with full range of motion.  Procedure:  Cryodestruction of skin tags on the left upper back, left anterior shoulder and left neck Consent obtained and verified. Time-out conducted. Noted  no overlying erythema, induration, or other signs of local infection. Completed without difficulty using Cryo-Gun. Advised to call if fevers/chills, erythema, induration, drainage, or persistent bleeding.  Impression and Recommendations:    The patient was counselled, risk factors were discussed, anticipatory guidance given.  Annual physical exam Annual physical as above. Ordering routine labs.  Skin  tag Cryotherapy of a skin tag on the left upper back, left anterior shoulder and left neck.   Irritable bowel syndrome Currently under excellent control, she did do an elimination diet and used FODMAP criteria, and things have improved considerably.   ___________________________________________ Ihor Austin. Benjamin Stain, M.D., ABFM., CAQSM. Primary Care and Sports Medicine [facility]  Adjunct Professor of Family Medicine  [employer]

## 2020-09-29 DIAGNOSIS — Z20822 Contact with and (suspected) exposure to covid-19: Secondary | ICD-10-CM | POA: Diagnosis not present

## 2020-09-29 DIAGNOSIS — R059 Cough, unspecified: Secondary | ICD-10-CM | POA: Diagnosis not present

## 2020-09-29 DIAGNOSIS — J029 Acute pharyngitis, unspecified: Secondary | ICD-10-CM | POA: Diagnosis not present

## 2020-09-29 DIAGNOSIS — R0981 Nasal congestion: Secondary | ICD-10-CM | POA: Diagnosis not present

## 2021-01-17 DIAGNOSIS — Z01419 Encounter for gynecological examination (general) (routine) without abnormal findings: Secondary | ICD-10-CM | POA: Diagnosis not present

## 2021-01-17 DIAGNOSIS — Z6822 Body mass index (BMI) 22.0-22.9, adult: Secondary | ICD-10-CM | POA: Diagnosis not present

## 2021-03-31 DIAGNOSIS — D485 Neoplasm of uncertain behavior of skin: Secondary | ICD-10-CM | POA: Diagnosis not present

## 2021-03-31 DIAGNOSIS — D2239 Melanocytic nevi of other parts of face: Secondary | ICD-10-CM | POA: Diagnosis not present

## 2021-03-31 DIAGNOSIS — D2261 Melanocytic nevi of right upper limb, including shoulder: Secondary | ICD-10-CM | POA: Diagnosis not present

## 2021-03-31 DIAGNOSIS — D225 Melanocytic nevi of trunk: Secondary | ICD-10-CM | POA: Diagnosis not present

## 2021-03-31 DIAGNOSIS — D224 Melanocytic nevi of scalp and neck: Secondary | ICD-10-CM | POA: Diagnosis not present

## 2021-03-31 DIAGNOSIS — D2262 Melanocytic nevi of left upper limb, including shoulder: Secondary | ICD-10-CM | POA: Diagnosis not present

## 2021-04-04 ENCOUNTER — Ambulatory Visit: Payer: BC Managed Care – PPO | Admitting: Sports Medicine

## 2021-04-11 ENCOUNTER — Ambulatory Visit: Payer: BC Managed Care – PPO | Admitting: Sports Medicine

## 2021-04-18 ENCOUNTER — Telehealth: Payer: Self-pay | Admitting: Sports Medicine

## 2021-04-18 ENCOUNTER — Encounter: Payer: BC Managed Care – PPO | Admitting: Sports Medicine

## 2021-04-18 DIAGNOSIS — Z0000 Encounter for general adult medical examination without abnormal findings: Secondary | ICD-10-CM

## 2021-04-18 NOTE — Telephone Encounter (Signed)
FYI - patient has been scheduled for CPE and cancelled 3 times in June.

## 2021-04-18 NOTE — Telephone Encounter (Signed)
Routine labs ordered, remind her she can come up stairs here to have them done.

## 2021-04-18 NOTE — Telephone Encounter (Signed)
Left msg that orders were in for patient to either come to our office or go to draw station downstairs to have labs done at her convenience.

## 2021-04-18 NOTE — Telephone Encounter (Signed)
Patient has her Physical scheduled for next week and would like for you to go ahead and order her labs so she can get them completed one morning before her Physical

## 2021-04-25 ENCOUNTER — Ambulatory Visit (INDEPENDENT_AMBULATORY_CARE_PROVIDER_SITE_OTHER): Payer: BC Managed Care – PPO | Admitting: Sports Medicine

## 2021-04-25 ENCOUNTER — Other Ambulatory Visit: Payer: Self-pay

## 2021-04-25 ENCOUNTER — Encounter: Payer: Self-pay | Admitting: Sports Medicine

## 2021-04-25 VITALS — BP 125/84 | HR 85 | Ht 65.0 in | Wt 141.0 lb

## 2021-04-25 DIAGNOSIS — Z0000 Encounter for general adult medical examination without abnormal findings: Secondary | ICD-10-CM | POA: Diagnosis not present

## 2021-04-25 DIAGNOSIS — M722 Plantar fascial fibromatosis: Secondary | ICD-10-CM | POA: Insufficient documentation

## 2021-04-25 DIAGNOSIS — K580 Irritable bowel syndrome with diarrhea: Secondary | ICD-10-CM | POA: Diagnosis not present

## 2021-04-25 MED ORDER — HYOSCYAMINE SULFATE ER 0.375 MG PO TB12: 0.3750 mg | ORAL_TABLET | Freq: Every day | ORAL | 3 refills | Status: DC | PRN

## 2021-04-25 MED ORDER — MELOXICAM 15 MG PO TABS: ORAL_TABLET | ORAL | 3 refills | Status: DC

## 2021-04-25 NOTE — Assessment & Plan Note (Signed)
Unremarkable annual physical as above. Deferring Tdap. Return in a year for this.

## 2021-04-25 NOTE — Assessment & Plan Note (Signed)
Well-controlled with Levbid, FODMAP diet. We will refill Levbid to [facility] and she will use a good Rx coupon.

## 2021-04-25 NOTE — Progress Notes (Signed)
Subjective:    CC: Annual Physical Exam  HPI:  This patient is here for their annual physical  I reviewed the past medical history, family history, social history, surgical history, and allergies today and no changes were needed.  Please see the problem list section below in epic for further details.  Past Medical History: No past medical history on file. Past Surgical History: No past surgical history on file. Social History: Social History   Socioeconomic History   Marital status: Single    Spouse name: Not on file   Number of children: Not on file   Years of education: Not on file   Highest education level: Not on file  Occupational History   Not on file  Tobacco Use   Smoking status: Never   Smokeless tobacco: Never  Substance and Sexual Activity   Alcohol use: Yes   Drug use: No   Sexual activity: Yes  Other Topics Concern   Not on file  Social History Narrative   Not on file   Social Determinants of Health   Financial Resource Strain: Not on file  Food Insecurity: Not on file  Transportation Needs: Not on file  Physical Activity: Not on file  Stress: Not on file  Social Connections: Not on file   Family History: Family History  Problem Relation Age of Onset   Depression Mother    Depression Father    Diabetes Father    Hypertension Father    Stroke Father    Cancer Maternal Uncle    Heart attack Paternal Uncle    Cancer Paternal Grandfather    Heart attack Paternal Grandfather    Hypertension Paternal Grandfather    Allergies: No Known Allergies Medications: See med rec.  Review of Systems: No headache, visual changes, nausea, vomiting, diarrhea, constipation, dizziness, abdominal pain, skin rash, fevers, chills, night sweats, swollen lymph nodes, weight loss, chest pain, body aches, joint swelling, muscle aches, shortness of breath, mood changes, visual or auditory hallucinations.  Objective:    General: Well Developed, well nourished, and  in no acute distress.  Neuro: Alert and oriented x3, extra-ocular muscles intact, sensation grossly intact. Cranial nerves II through XII are intact, motor, sensory, and coordinative functions are all intact. HEENT: Normocephalic, atraumatic, pupils equal round reactive to light, neck supple, no masses, no lymphadenopathy, thyroid nonpalpable. Oropharynx, nasopharynx, external ear canals are unremarkable. Skin: Warm and dry, no rashes noted.  Cardiac: Regular rate and rhythm, no murmurs rubs or gallops.  Respiratory: Clear to auscultation bilaterally. Not using accessory muscles, speaking in full sentences.  Abdominal: Soft, nontender, nondistended, positive bowel sounds, no masses, no organomegaly.  Musculoskeletal: Shoulder, elbow, wrist, hip, knee, ankle stable, and with full range of motion.  Impression and Recommendations:    The patient was counselled, risk factors were discussed, anticipatory guidance given.  Annual physical exam Unremarkable annual physical as above. Deferring Tdap. Return in a year for this.  Irritable bowel syndrome Well-controlled with Levbid, FODMAP diet. We will refill Levbid to [facility] and she will use a good Rx coupon.  Plantar fasciitis, left Classic Plantar fasciitis, worse with the first few steps in the morning, and worse after a long drive. Adding rehab exercises, referral to Dr. Jordan Likes for custom orthotics, meloxicam for 2 weeks. Avoid barefoot walking. Return to see me in 3 to 4 weeks, consider injection versus nocturnal splinting if no better   ___________________________________________ Ihor Austin. Benjamin Stain, M.D., ABFM., CAQSM. Primary Care and Sports Medicine [facility]  Adjunct Professor of [employer]

## 2021-04-25 NOTE — Assessment & Plan Note (Signed)
Classic Plantar fasciitis, worse with the first few steps in the morning, and worse after a long drive. Adding rehab exercises, referral to Dr. Jordan Likes for custom orthotics, meloxicam for 2 weeks. Avoid barefoot walking. Return to see me in 3 to 4 weeks, consider injection versus nocturnal splinting if no better

## 2021-04-26 LAB — COMPREHENSIVE METABOLIC PANEL
AG Ratio: 1.8 (calc) (ref 1.0–2.5)
ALT: 17 U/L (ref 6–29)
AST: 16 U/L (ref 10–30)
Albumin: 4.1 g/dL (ref 3.6–5.1)
Alkaline phosphatase (APISO): 46 U/L (ref 31–125)
BUN: 15 mg/dL (ref 7–25)
CO2: 27 mmol/L (ref 20–32)
Calcium: 9.6 mg/dL (ref 8.6–10.2)
Chloride: 103 mmol/L (ref 98–110)
Creat: 0.79 mg/dL (ref 0.50–1.10)
Globulin: 2.3 g/dL (calc) (ref 1.9–3.7)
Glucose, Bld: 89 mg/dL (ref 65–99)
Potassium: 4.3 mmol/L (ref 3.5–5.3)
Sodium: 138 mmol/L (ref 135–146)
Total Bilirubin: 0.5 mg/dL (ref 0.2–1.2)
Total Protein: 6.4 g/dL (ref 6.1–8.1)

## 2021-04-26 LAB — LIPID PANEL
Cholesterol: 180 mg/dL (ref <200)
HDL: 73 mg/dL (ref >=50)
LDL Cholesterol (Calc): 90 mg/dL (calc)
Non-HDL Cholesterol (Calc): 107 mg/dL (calc) (ref <130)
Total CHOL/HDL Ratio: 2.5 (calc) (ref <5.0)
Triglycerides: 83 mg/dL (ref <150)

## 2021-04-26 LAB — CBC
HCT: 41.2 % (ref 35.0–45.0)
Hemoglobin: 13.8 g/dL (ref 11.7–15.5)
MCH: 33.1 pg — ABNORMAL HIGH (ref 27.0–33.0)
MCHC: 33.5 g/dL (ref 32.0–36.0)
MCV: 98.8 fL (ref 80.0–100.0)
MPV: 10.1 fL (ref 7.5–12.5)
Platelets: 220 10*3/uL (ref 140–400)
RBC: 4.17 10*6/uL (ref 3.80–5.10)
RDW: 12.1 % (ref 11.0–15.0)
WBC: 5.4 10*3/uL (ref 3.8–10.8)

## 2021-04-26 LAB — TSH: TSH: 1.39 mIU/L

## 2021-05-14 ENCOUNTER — Ambulatory Visit (INDEPENDENT_AMBULATORY_CARE_PROVIDER_SITE_OTHER): Payer: BC Managed Care – PPO | Admitting: Family Medicine

## 2021-05-14 ENCOUNTER — Other Ambulatory Visit: Payer: Self-pay

## 2021-05-14 ENCOUNTER — Encounter: Payer: Self-pay | Admitting: Family Medicine

## 2021-05-14 DIAGNOSIS — M722 Plantar fascial fibromatosis: Secondary | ICD-10-CM

## 2021-05-14 NOTE — Assessment & Plan Note (Signed)
Symptoms seem most consistent with Planter fasciitis. -Counseled on home exercise therapy and supportive care. -Orthotics today. -Could consider adding scaphoid pads.

## 2021-05-14 NOTE — Progress Notes (Signed)
  Alice Johnston - 35 y.o. female MRN [id number]  Date of birth: 12-04-1985  SUBJECTIVE:  Including CC & ROS.  No chief complaint on file.   Alice Johnston is a 35 y.o. female that is presenting with left foot pain.  Foot pain is worse after prolonged walking.  No specific injury or inciting event.   Review of Systems See HPI   HISTORY: Past Medical, Surgical, Social, and Family History Reviewed & Updated per EMR.   Pertinent Historical Findings include:  History reviewed. No pertinent past medical history.  History reviewed. No pertinent surgical history.  Family History  Problem Relation Age of Onset   Depression Mother    Depression Father    Diabetes Father    Hypertension Father    Stroke Father    Cancer Maternal Uncle    Heart attack Paternal Uncle    Cancer Paternal Grandfather    Heart attack Paternal Grandfather    Hypertension Paternal Grandfather     Social History   Socioeconomic History   Marital status: Single    Spouse name: Not on file   Number of children: Not on file   Years of education: Not on file   Highest education level: Not on file  Occupational History   Not on file  Tobacco Use   Smoking status: Never   Smokeless tobacco: Never  Substance and Sexual Activity   Alcohol use: Yes   Drug use: No   Sexual activity: Yes  Other Topics Concern   Not on file  Social History Narrative   Not on file   Social Determinants of Health   Financial Resource Strain: Not on file  Food Insecurity: Not on file  Transportation Needs: Not on file  Physical Activity: Not on file  Stress: Not on file  Social Connections: Not on file  Intimate Partner Violence: Not on file     PHYSICAL EXAM:  VS: Ht 5\' 5"  (1.651 m)   Wt 141 lb (64 kg)   BMI 23.46 kg/m  Physical Exam Gen: NAD, alert, cooperative with exam, well-appearing MSK:  Left foot: Some tenderness to palpation at the base of the calcaneus. Normal range of motion. Normal strength  resistance. Neurovascular intact  Patient was fitted for a standard, cushioned, semi-rigid orthotic. The orthotic was heated and afterward the patient stood on the orthotic blank positioned on the orthotic stand. The patient was positioned in subtalar neutral position and 10 degrees of ankle dorsiflexion in a weight bearing stance. After completion of molding, a stable base was applied to the orthotic blank. The blank was ground to a stable position for weight bearing. Size: 74M Pairs: 2 Base: Blue EVA Additional Posting and Padding: None The patient ambulated these, and they were very comfortable.    ASSESSMENT & PLAN:   Plantar fasciitis, left Symptoms seem most consistent with Planter fasciitis. -Counseled on home exercise therapy and supportive care. -Orthotics today. -Could consider adding scaphoid pads.

## 2021-05-16 ENCOUNTER — Ambulatory Visit: Payer: BC Managed Care – PPO | Admitting: Sports Medicine

## 2021-06-06 ENCOUNTER — Ambulatory Visit: Payer: BC Managed Care – PPO | Admitting: Sports Medicine

## 2021-06-20 ENCOUNTER — Ambulatory Visit: Payer: BC Managed Care – PPO | Admitting: Sports Medicine

## 2021-06-27 ENCOUNTER — Other Ambulatory Visit: Payer: Self-pay

## 2021-06-27 ENCOUNTER — Ambulatory Visit (INDEPENDENT_AMBULATORY_CARE_PROVIDER_SITE_OTHER): Payer: BC Managed Care – PPO | Admitting: Sports Medicine

## 2021-06-27 DIAGNOSIS — M722 Plantar fascial fibromatosis: Secondary | ICD-10-CM

## 2021-06-27 DIAGNOSIS — F909 Attention-deficit hyperactivity disorder, unspecified type: Secondary | ICD-10-CM

## 2021-06-27 DIAGNOSIS — F411 Generalized anxiety disorder: Secondary | ICD-10-CM | POA: Diagnosis not present

## 2021-06-27 DIAGNOSIS — K580 Irritable bowel syndrome with diarrhea: Secondary | ICD-10-CM

## 2021-06-27 MED ORDER — AMPHETAMINE-DEXTROAMPHETAMINE 10 MG PO TABS: 5.0000 mg | ORAL_TABLET | Freq: Every day | ORAL | 0 refills | Status: DC

## 2021-06-27 MED ORDER — ONDANSETRON 8 MG PO TBDP: 8.0000 mg | ORAL_TABLET | Freq: Three times a day (TID) | ORAL | 3 refills | Status: DC | PRN

## 2021-06-27 MED ORDER — ALPRAZOLAM 0.5 MG PO TABS: 0.5000 mg | ORAL_TABLET | Freq: Every day | ORAL | 3 refills | Status: DC | PRN

## 2021-06-27 MED ORDER — HYOSCYAMINE SULFATE ER 0.375 MG PO TB12: 0.3750 mg | ORAL_TABLET | Freq: Every day | ORAL | 3 refills | Status: DC | PRN

## 2021-06-27 NOTE — Assessment & Plan Note (Signed)
Resolved with avoidance of barefoot walking, custom orthotics. Return as needed for this.

## 2021-06-27 NOTE — Progress Notes (Signed)
    Procedures performed today:    None.  Independent interpretation of notes and tests performed by another provider:   None.  Brief History, Exam, Impression, and Recommendations:    Plantar fasciitis, left Resolved with avoidance of barefoot walking, custom orthotics. Return as needed for this.  Adult ADHD Focusing with a crash midday. Has done well with Adderall in the past, adding Adderall 10 mg tablets, standard release, she can do a half tab. She will return to see me in about 4 weeks to evaluate efficacy and blood pressure.    ___________________________________________ Ihor Austin. Benjamin Stain, M.D., ABFM., CAQSM. Primary Care and Sports Medicine [facility]  Adjunct Instructor of Family Medicine  [employer]

## 2021-06-27 NOTE — Assessment & Plan Note (Signed)
Focusing with a crash midday. Has done well with Adderall in the past, adding Adderall 10 mg tablets, standard release, she can do a half tab. She will return to see me in about 4 weeks to evaluate efficacy and blood pressure.

## 2021-07-25 ENCOUNTER — Ambulatory Visit: Payer: BC Managed Care – PPO | Admitting: Sports Medicine

## 2021-08-08 ENCOUNTER — Ambulatory Visit: Payer: BC Managed Care – PPO | Admitting: Sports Medicine

## 2021-08-15 ENCOUNTER — Ambulatory Visit: Payer: BC Managed Care – PPO | Admitting: Sports Medicine

## 2021-08-18 ENCOUNTER — Ambulatory Visit (INDEPENDENT_AMBULATORY_CARE_PROVIDER_SITE_OTHER): Payer: BC Managed Care – PPO | Admitting: Sports Medicine

## 2021-08-18 ENCOUNTER — Other Ambulatory Visit: Payer: Self-pay

## 2021-08-18 ENCOUNTER — Other Ambulatory Visit: Payer: Self-pay | Admitting: Sports Medicine

## 2021-08-18 DIAGNOSIS — F909 Attention-deficit hyperactivity disorder, unspecified type: Secondary | ICD-10-CM

## 2021-08-18 MED ORDER — AMPHETAMINE-DEXTROAMPHETAMINE 10 MG PO TABS: 5.0000 mg | ORAL_TABLET | Freq: Every day | ORAL | 0 refills | Status: DC

## 2021-08-18 NOTE — Progress Notes (Signed)
    Procedures performed today:    None.  Independent interpretation of notes and tests performed by another provider:   None.  Brief History, Exam, Impression, and Recommendations:    Adult ADHD Merrilee returns, she is doing really well on 1/2-1 tabs of Adderall, refilling, return as needed for this.    ___________________________________________ Ihor Austin. Benjamin Stain, M.D., ABFM., CAQSM. Primary Care and Sports Medicine [facility]  Adjunct Instructor of Family Medicine  [employer]

## 2021-08-18 NOTE — Assessment & Plan Note (Signed)
Alice Johnston returns, she is doing really well on 1/2-1 tabs of Adderall, refilling, return as needed for this.

## 2022-01-19 DIAGNOSIS — Z01419 Encounter for gynecological examination (general) (routine) without abnormal findings: Secondary | ICD-10-CM | POA: Diagnosis not present

## 2022-01-19 DIAGNOSIS — N951 Menopausal and female climacteric states: Secondary | ICD-10-CM | POA: Diagnosis not present

## 2022-01-19 DIAGNOSIS — Z6823 Body mass index (BMI) 23.0-23.9, adult: Secondary | ICD-10-CM | POA: Diagnosis not present

## 2022-01-19 DIAGNOSIS — R232 Flushing: Secondary | ICD-10-CM | POA: Diagnosis not present

## 2022-05-06 ENCOUNTER — Ambulatory Visit: Payer: BC Managed Care – PPO | Admitting: Sports Medicine

## 2022-05-28 ENCOUNTER — Encounter: Payer: Self-pay | Admitting: Sports Medicine

## 2022-05-29 DIAGNOSIS — R1032 Left lower quadrant pain: Secondary | ICD-10-CM | POA: Diagnosis not present

## 2022-05-29 DIAGNOSIS — N760 Acute vaginitis: Secondary | ICD-10-CM | POA: Diagnosis not present

## 2022-05-29 DIAGNOSIS — R152 Fecal urgency: Secondary | ICD-10-CM | POA: Diagnosis not present

## 2022-05-29 DIAGNOSIS — K219 Gastro-esophageal reflux disease without esophagitis: Secondary | ICD-10-CM | POA: Diagnosis not present

## 2022-05-29 DIAGNOSIS — R197 Diarrhea, unspecified: Secondary | ICD-10-CM | POA: Diagnosis not present

## 2022-05-29 DIAGNOSIS — R102 Pelvic and perineal pain: Secondary | ICD-10-CM | POA: Diagnosis not present

## 2022-06-02 ENCOUNTER — Ambulatory Visit (INDEPENDENT_AMBULATORY_CARE_PROVIDER_SITE_OTHER): Payer: BC Managed Care – PPO

## 2022-06-02 ENCOUNTER — Encounter: Payer: Self-pay | Admitting: Sports Medicine

## 2022-06-02 ENCOUNTER — Ambulatory Visit (INDEPENDENT_AMBULATORY_CARE_PROVIDER_SITE_OTHER): Payer: BC Managed Care – PPO | Admitting: Sports Medicine

## 2022-06-02 DIAGNOSIS — N946 Dysmenorrhea, unspecified: Secondary | ICD-10-CM | POA: Insufficient documentation

## 2022-06-02 DIAGNOSIS — F909 Attention-deficit hyperactivity disorder, unspecified type: Secondary | ICD-10-CM

## 2022-06-02 DIAGNOSIS — M899 Disorder of bone, unspecified: Secondary | ICD-10-CM

## 2022-06-02 DIAGNOSIS — K580 Irritable bowel syndrome with diarrhea: Secondary | ICD-10-CM

## 2022-06-02 DIAGNOSIS — F411 Generalized anxiety disorder: Secondary | ICD-10-CM

## 2022-06-02 DIAGNOSIS — R102 Pelvic and perineal pain: Secondary | ICD-10-CM | POA: Diagnosis not present

## 2022-06-02 MED ORDER — AMPHETAMINE-DEXTROAMPHETAMINE 10 MG PO TABS: 5.0000 mg | ORAL_TABLET | Freq: Every day | ORAL | 0 refills | Status: DC

## 2022-06-02 MED ORDER — HYOSCYAMINE SULFATE ER 0.375 MG PO TB12: ORAL_TABLET | ORAL | 11 refills | Status: DC

## 2022-06-02 MED ORDER — ALPRAZOLAM 0.5 MG PO TABS: 0.5000 mg | ORAL_TABLET | Freq: Every day | ORAL | 3 refills | Status: DC | PRN

## 2022-06-02 MED ORDER — PREDNISONE 50 MG PO TABS: ORAL_TABLET | ORAL | 0 refills | Status: DC

## 2022-06-02 NOTE — Progress Notes (Signed)
    Procedures performed today:    None.  Independent interpretation of notes and tests performed by another provider:   None.  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 Worse with standing. She has seen a gastroenterologist who suggested some dietary changes for IBS such as decreasing artificial sweeteners, increasing fiber in diet. She was also referred to rheumatology for concern that her pain was coming from her hip. 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 We will start 5 days of prednisone, left hip/pelvic x-rays. Home conditioning, if insufficient improvement we will consider pelvic MRI.  Irritable bowel syndrome Keesha also has what sounds to be IBS with tenesmus after eating. She is working on a low FODMAP diet, she has done this in the past and it seemed to help. She did see gastroenterologist, it was suggested that she decrease her artificial sweetener intake, increase her fiber. She also gets symptoms with dairy consumption so I have advised using Lactaid milk and decreasing dairy consumption for now. Gastroenterology did not suspect that this was bad enough to consider a colonoscopy. I will also refill her Levbid. Some of her left lower pelvic pain was concerning for endometriosis, she had an ultrasound which was negative but I did explain that endometriosis cannot be seen on ultrasound. We will further evaluate this if persistent discomfort. We did discuss  combination oral contraceptives, and Nazaria was somewhat hesitant so we will hold off on treatment for now.    ____________________________________________ Ihor Austin. Benjamin Stain, M.D., ABFM., CAQSM., AME. Primary Care and Sports Medicine [facility]  Adjunct Professor of Family Medicine  [employer]  Restaurant manager, fast food

## 2022-06-02 NOTE — Assessment & Plan Note (Signed)
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 Worse with standing. She has seen a gastroenterologist who suggested some dietary changes for IBS such as decreasing artificial sweeteners, increasing fiber in diet. She was also referred to rheumatology for concern that her pain was coming from her hip. 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 We will start 5 days of prednisone, left hip/pelvic x-rays. Home conditioning, if insufficient improvement we will consider pelvic MRI.

## 2022-06-02 NOTE — Assessment & Plan Note (Signed)
Alice Johnston also has what sounds to be IBS with tenesmus after eating. She is working on a low FODMAP diet, she has done this in the past and it seemed to help. She did see gastroenterologist, it was suggested that she decrease her artificial sweetener intake, increase her fiber. She also gets symptoms with dairy consumption so I have advised using Lactaid milk and decreasing dairy consumption for now. Gastroenterology did not suspect that this was bad enough to consider a colonoscopy. I will also refill her Levbid. Some of her left lower pelvic pain was concerning for endometriosis, she had an ultrasound which was negative but I did explain that endometriosis cannot be seen on ultrasound. We will further evaluate this if persistent discomfort. We did discuss combination oral contraceptives, and [name] was somewhat hesitant so we will hold off on treatment for now.

## 2022-06-22 NOTE — Progress Notes (Unsigned)
Office Visit Note  Patient: Alice Johnston             Date of Birth: 02/01/1986           MRN: [id number]             PCP: Monica Becton, MD Referring: Charna Elizabeth, MD Visit Date: 06/24/2022 Occupation: @GUAROCC @  Subjective:  Pain in multiple joints  History of Present Illness: Alice Johnston is a 36 y.o. female seen in consultation per request of Dr. 31.  According the patient her symptoms a started with left planter fasciitis in July 2022.  She has been under care of Dr. August 2022.  She has had orthotics which has been helpful.  She states the Planter fasciitis never resolves but states under control.  Never had cortisone injections.  She states that 4 weeks ago she started having insomnia and left quadrant pain.  She thought her symptoms were related to IBS.  Next morning she took ibuprofen which helped only to some extent.  The symptoms got worse over the next few days and she was referred to Dr. Benjamin Stain.  Dr. Loreta Ave evaluated her and did not find her symptoms were related to IBS.  She felt her symptoms were coming from the pubic symphysis and the left hip.  She also obtain some lab work which was unremarkable.  She was seen by her GYN and had vaginal ultrasound which was also unremarkable.  She went back to see Dr. Loreta Ave who did x-rays of her hip joints.  She was also given prednisone 50 mg p.o. daily for 5 days.  According the patient the prednisone started helping after 3 days but after the prednisone was finished a week later her symptoms came back.  She has been having pain and discomfort in her left groin and pubic region.  She states the pain gets worse when she is sitting and gets better when she is standing.  She denies any radiculopathy.  There is no history of numbness.  She gives history of chronic lower back pain for many years related to scoliosis.  She denies any SI joint pain.  She gives history of psoriasis since she was in the elementary school.  She sees a dermatologist  on a regular basis.  She has not used any topical agents in a long time.  She states the symptoms are worse during the winter months and mostly on her elbows and extremities.  She states with the left hip pain she also developed a patch on her left arm.  Denies any history of uveitis.  She denies any SI joint pain.  Is no family history of psoriatic arthritis or psoriasis.  She is gravida 1, para 0, miscarriage 1.  There is no history of DVTs.  Activities of Daily Living:  Patient reports morning stiffness for a few minutes.   Patient Reports nocturnal pain.  Difficulty dressing/grooming: Denies Difficulty climbing stairs: Denies Difficulty getting out of chair: Denies Difficulty using hands for taps, buttons, cutlery, and/or writing: Reports  Review of Systems  Constitutional:  Positive for fatigue.  HENT:  Negative for mouth sores and mouth dryness.   Eyes:  Negative for dryness.  Respiratory:  Negative for shortness of breath.   Cardiovascular:  Negative for chest pain and palpitations.  Gastrointestinal:  Positive for constipation and diarrhea. Negative for blood in stool.  Endocrine: Negative for increased urination.  Genitourinary:  Negative for involuntary urination.  Musculoskeletal:  Positive for joint pain, joint pain, joint  swelling and morning stiffness. Negative for gait problem, myalgias, muscle weakness, muscle tenderness and myalgias.  Skin:  Negative for color change, rash, hair loss and sensitivity to sunlight.  Allergic/Immunologic: Negative for susceptible to infections.  Neurological:  Positive for headaches. Negative for dizziness.  Hematological:  Negative for swollen glands.  Psychiatric/Behavioral:  Positive for sleep disturbance. Negative for depressed mood. The patient is not nervous/anxious.     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 No pertinent past medical history.  Family History  Problem Relation Age of Onset   Depression Mother    Depression Father    Diabetes Father    Hypertension Father    Stroke Father    Heart disease Father    Cancer Maternal Uncle    Heart attack Paternal Uncle    Cancer Paternal Grandfather    Heart attack Paternal Grandfather    Hypertension Paternal Grandfather    History reviewed. No pertinent surgical history. Social History   Social History Narrative   Not on file    There is no immunization history on file for this patient.   Objective: Vital Signs: BP 123/76 (BP Location: Right Arm, Patient Position: Sitting, Cuff Size: Normal)   Pulse 86   Resp 15   Ht 5\' 5"  (1.651 m)   Wt 143 lb (64.9 kg)   LMP 05/19/2022   BMI 23.80 kg/m    Physical Exam Vitals and nursing note reviewed.  Constitutional:      Appearance: She is well-developed.  HENT:     Head: Normocephalic and atraumatic.  Eyes:     Conjunctiva/sclera: Conjunctivae normal.  Cardiovascular:     Rate and Rhythm: Normal rate and regular rhythm.     Heart sounds: Normal heart sounds.  Pulmonary:     Effort: Pulmonary effort is normal.     Breath sounds: Normal breath sounds.  Abdominal:     General: Bowel sounds are normal.     Palpations: Abdomen is soft.  Musculoskeletal:     Cervical back: Normal range of motion.  Lymphadenopathy:     Cervical: No cervical adenopathy.  Skin:    General: Skin is warm and dry.     Capillary Refill: Capillary refill takes less than 2 seconds.     Comments: Psoriasis patches were noted over bilateral elbows.  Circular was erythematous indurated lesion was noted over left upper arm consistent with tinea corporis.  Neurological:     Mental Status: She is alert and  oriented to person, place, and time.  Psychiatric:        Behavior: Behavior normal.      Musculoskeletal Exam: C-spine was in good range of motion.  She had dextroscoliosis of the thoracolumbar spine.  She had good mobility in her lumbar spine.  There is no SI joint tenderness.  Shoulder joints, elbow joints, wrist joints were in good range of motion.  She has synovitis of her right second PIP joint with decreased range of motion.  Hip joints and knee joints with good range of motion.  She had discomfort with range of motion of her left hip joint and also tenderness over pubic symphysis.  There was no  tenderness over ankles or MTPs.  She had tenderness over left heel.  CDAI Exam: CDAI Score: 3.2  Patient Global: 6 mm; Provider Global: 6 mm Swollen: 1 ; Tender: 2  Joint Exam 06/24/2022      Right  Left  PIP 2  Swollen Tender     Hip      Tender     Investigation: No additional findings.  Imaging: DG HIP UNILAT W OR W/O PELVIS 2-3 VIEWS LEFT  Result Date: 06/02/2022 CLINICAL DATA:  Anterior pelvic rim and pubic symphysis pain EXAM: DG HIP (WITH OR WITHOUT PELVIS) 2-3V LEFT COMPARISON:  CT abdomen and pelvis 09/03/2014 FINDINGS: There is no evidence of hip fracture, dislocation, or diastasis. There is no evidence of arthropathy or other focal bone abnormality. IMPRESSION: Negative. Electronically Signed   By: Minerva Fester M.D.   On: 06/02/2022 22:29    Recent Labs: Lab Results  Component Value Date   WBC 5.4 04/25/2021   HGB 13.8 04/25/2021   PLT 220 04/25/2021   NA 138 04/25/2021   K 4.3 04/25/2021   CL 103 04/25/2021   CO2 27 04/25/2021   GLUCOSE 89 04/25/2021   BUN 15 04/25/2021   CREATININE 0.79 04/25/2021   BILITOT 0.5 04/25/2021   ALKPHOS 36 (L) 06/14/2014   AST 16 04/25/2021   ALT 17 04/25/2021   PROT 6.4 04/25/2021   ALBUMIN 4.5 06/14/2014   CALCIUM 9.6 04/25/2021   GFRAA 111 05/24/2018   June 01, 2022 CBC WBC 6.3, hemoglobin 14.2, platelets 236 CMP GFR  107, ALT 13, AST 15, IgA normal, TTG negative, endomysial antibody negative Speciality Comments: No specialty comments available.  Procedures:  No procedures performed Allergies: Patient has no known allergies.   Assessment / Plan:     Visit Diagnoses: Psoriatic arthropathy (HCC)-patient gives history of ongoing left foot planter fasciitis for the last 1 year.  She has been having ongoing pain and discomfort in her left hip joint and pubic region for the last month.  She states that she has nocturnal pain in her left hip joint in the pubic region.  As she also describes some discomfort in the left lower quadrant.  She had GI and GYN evaluation which was unremarkable.  She had x-rays done by Dr. Benjamin Stain which were unremarkable.  She continues to have pain and discomfort in her left hip joint in the left pubic region.  She had tenderness over left trochanteric bursa.  She has history of morning stiffness.  She also has longstanding history of psoriasis since she was in the elementary school.  I did detailed discussion with the patient regarding the diagnosis of psoriatic arthritis.  Different treatment options and their side effects were discussed at length.  After discussing different treatment options she wanted to proceed with Tremfya.  We will apply for Tremfya.  Counseled patient that Tremfya is a IL-23 inhibitor.  Counseled patient on purpose, proper use, and adverse effects of Tremfya.  Reviewed the most common adverse effects including infection, URTIs, injection site reactions, nausea/diarrhea, and recurrence of tinea and HSV infections Counseled patient that Tremfya should be held for infection and prior to scheduled surgery.  Recommend annual influenza, PCV 15 or PCV20 or Pneumovax 23, and Shingrix as indicated.  Reviewed the importance of regular labs while on Tremfya therapy.  Will monitor CBC and CMP 1 month after starting and then every 3 months routinely thereafter. Will monitor TB  gold annually. Standing orders placed.  Provided patient with medication education  material and answered all questions.  Patient voiced understanding.  Patient consented to [facility].  Will upload consent into the media tab.  Reviewed storage instructions of Tremfya.  Advised initial injection must be administered in office.  Patient verbalized understanding.  Will apply for Tremfya through patient's insurance and update when we receive a response.  Dose will be Tremfya 100 mg on weeks 0 and 4 then every 8 weeks thereafter.  Prescription pending lab results and insurance approval.  High risk medication use -I will obtain following labs to prepare for immunosuppressive therapy.  Plan: Hepatitis B core antibody, IgM, Hepatitis B surface antigen, Hepatitis C antibody, QuantiFERON-TB Gold Plus, Serum protein electrophoresis with reflex, IgG, IgA, IgM, Glucose 6 phosphate dehydrogenase  Pain in both hands -she complains of stiffness in her hands.  She had synovitis in her right second PIP joint today.  Plan: XR Hand 2 View Right, XR Hand 2 View Left.  X-rays of bilateral hands were unremarkable.  Pain in left hip -she has been experiencing left hip pain for the last month.  She had good response to prednisone taper which lasted until the prednisone taper was over and then the symptoms recurred.  She continues to have pain and discomfort in her left hip and the left pubic region.    Trochanteric bursitis, left hip-she had tenderness over the left trochanteric bursa consistent with trochanteric bursitis.  IT band stretches were discussed.  Pain in both feet -she has stiffness in her feet but no synovitis was noted.  She has been having recurrent planter fasciitis.  Plan: XR Foot 2 Views Right, XR Foot 2 Views Left.  X-rays showed early degenerative changes.  Plantar fasciitis, left-she gives history of recurrent planter fasciitis in her left foot for the last 1 year.  She has been under care of Dr. Benjamin Stain.   Patient states she has tried orthotics which has been helpful.  Pubic bone pain-she continues to have discomfort in the pubic region.  Psoriasis-she had psoriasis patches on her elbows.  She had few dry scales on her face.  She cannot using any topical agents currently.  She has been followed by a dermatologist.  Tinea corporis -she noticed a lesion on her left upper arm about a month ago.  The clinical findings are consistent with tinea corporis.  Treatment for tinea corporis was discussed.  She has a dog and I advised her to take her dog to the vet.  A prescription for ketoconazole was given.  Plan: ketoconazole (NIZORAL) 2 % cream  Irritable bowel syndrome with diarrhea-she has longstanding history of IBS.  Patient states the symptoms are better since she has been on fiber supplement.  History of gastroesophageal reflux (GERD)-currently asymptomatic.  She states she is very sensitive stomach.  She gets nauseated even while she is driving.  Fecal urgency-improved.  Other migraine without status migrainosus, not intractable  Dysmenorrhea  Generalized anxiety disorder  Adult ADHD  Orders: Orders Placed This Encounter  Procedures   XR Hand 2 View Right   XR Hand 2 View Left   XR Foot 2 Views Right   XR Foot 2 Views Left   Hepatitis B core antibody, IgM   Hepatitis B surface antigen   Hepatitis C antibody   QuantiFERON-TB Gold Plus   Serum protein electrophoresis with reflex   IgG, IgA, IgM   Glucose 6 phosphate dehydrogenase   Meds ordered this encounter  Medications   ketoconazole (NIZORAL) 2 % cream    Sig: Apply  1 Application topically daily. For 2 weeks.    Dispense:  15 g    Refill:  0    Face-to-face time spent with patient was 60 minutes. Greater than 50% of time was spent in counseling and coordination of care.  Follow-Up Instructions: Return for Psoriatic arthritis.   Pollyann SavoyShaili Deveshwar, MD  Note - This record has been created using Animal nutritionistDragon software.  Chart  creation errors have been sought, but may not always  have been located. Such creation errors do not reflect on  the standard of medical care.

## 2022-06-24 ENCOUNTER — Ambulatory Visit (INDEPENDENT_AMBULATORY_CARE_PROVIDER_SITE_OTHER): Payer: BC Managed Care – PPO

## 2022-06-24 ENCOUNTER — Ambulatory Visit: Payer: BC Managed Care – PPO | Attending: Rheumatology | Admitting: Rheumatology

## 2022-06-24 ENCOUNTER — Telehealth: Payer: Self-pay | Admitting: Pharmacist

## 2022-06-24 ENCOUNTER — Encounter: Payer: Self-pay | Admitting: Rheumatology

## 2022-06-24 VITALS — BP 123/76 | HR 86 | Resp 15 | Ht 65.0 in | Wt 143.0 lb

## 2022-06-24 DIAGNOSIS — L4050 Arthropathic psoriasis, unspecified: Secondary | ICD-10-CM

## 2022-06-24 DIAGNOSIS — M899 Disorder of bone, unspecified: Secondary | ICD-10-CM

## 2022-06-24 DIAGNOSIS — B354 Tinea corporis: Secondary | ICD-10-CM

## 2022-06-24 DIAGNOSIS — F909 Attention-deficit hyperactivity disorder, unspecified type: Secondary | ICD-10-CM

## 2022-06-24 DIAGNOSIS — Z79899 Other long term (current) drug therapy: Secondary | ICD-10-CM | POA: Diagnosis not present

## 2022-06-24 DIAGNOSIS — N946 Dysmenorrhea, unspecified: Secondary | ICD-10-CM

## 2022-06-24 DIAGNOSIS — M79672 Pain in left foot: Secondary | ICD-10-CM | POA: Diagnosis not present

## 2022-06-24 DIAGNOSIS — M79641 Pain in right hand: Secondary | ICD-10-CM | POA: Diagnosis not present

## 2022-06-24 DIAGNOSIS — Z8719 Personal history of other diseases of the digestive system: Secondary | ICD-10-CM

## 2022-06-24 DIAGNOSIS — K580 Irritable bowel syndrome with diarrhea: Secondary | ICD-10-CM

## 2022-06-24 DIAGNOSIS — F411 Generalized anxiety disorder: Secondary | ICD-10-CM

## 2022-06-24 DIAGNOSIS — M25552 Pain in left hip: Secondary | ICD-10-CM

## 2022-06-24 DIAGNOSIS — G43809 Other migraine, not intractable, without status migrainosus: Secondary | ICD-10-CM

## 2022-06-24 DIAGNOSIS — M79671 Pain in right foot: Secondary | ICD-10-CM

## 2022-06-24 DIAGNOSIS — R152 Fecal urgency: Secondary | ICD-10-CM

## 2022-06-24 DIAGNOSIS — M722 Plantar fascial fibromatosis: Secondary | ICD-10-CM

## 2022-06-24 DIAGNOSIS — M79642 Pain in left hand: Secondary | ICD-10-CM

## 2022-06-24 DIAGNOSIS — L409 Psoriasis, unspecified: Secondary | ICD-10-CM

## 2022-06-24 DIAGNOSIS — M7062 Trochanteric bursitis, left hip: Secondary | ICD-10-CM

## 2022-06-24 MED ORDER — KETOCONAZOLE 2 % EX CREA: 1.0000 | TOPICAL_CREAM | Freq: Every day | CUTANEOUS | 0 refills | Status: AC

## 2022-06-24 NOTE — Telephone Encounter (Signed)
Please start Tremfya autoinjector BIV.  Dose: 100mg  at Week 0, Week 4, then every 12 weeks thereafter  Dx: Psoriatic arthritis (L40.5) and Psoriasis (L40.9)  Once approved, patient is eligible for copay card. She travels for work  , PharmD, MPH, BCPS, CPP Clinical Pharmacist (Rheumatology and Pulmonology)

## 2022-06-24 NOTE — Progress Notes (Unsigned)
Pharmacy Note  Subjective: Patient presents today to [facility] for follow up office visit.  Patient seen by the pharmacist for counseling on Tremfya for psoriatic arthritis and plaque psoriasis.  She is naive to biologics.   Objective:  CBC    Component Value Date/Time   WBC 5.4 04/25/2021 0827   RBC 4.17 04/25/2021 0827   HGB 13.8 04/25/2021 0827   HCT 41.2 04/25/2021 0827   PLT 220 04/25/2021 0827   MCV 98.8 04/25/2021 0827   MCH 33.1 (H) 04/25/2021 0827   MCHC 33.5 04/25/2021 0827   RDW 12.1 04/25/2021 0827   LYMPHSABS 1,678 05/24/2018 1527   EOSABS 79 05/24/2018 1527   BASOSABS 50 05/24/2018 1527     CMP     Component Value Date/Time   NA 138 04/25/2021 0827   K 4.3 04/25/2021 0827   CL 103 04/25/2021 0827   CO2 27 04/25/2021 0827   GLUCOSE 89 04/25/2021 0827   BUN 15 04/25/2021 0827   CREATININE 0.79 04/25/2021 0827   CALCIUM 9.6 04/25/2021 0827   PROT 6.4 04/25/2021 0827   ALBUMIN 4.5 06/14/2014 0921   AST 16 04/25/2021 0827   ALT 17 04/25/2021 0827   ALKPHOS 36 (L) 06/14/2014 0921   BILITOT 0.5 04/25/2021 0827   GFRNONAA 96 05/24/2018 1527   GFRAA 111 05/24/2018 1527     Baseline Immunosuppressant Therapy Labs Baseline labs drawn today.  SPEP    Latest Ref Rng & Units 04/25/2021    8:27 AM  Serum Protein Electrophoresis  Total Protein 6.1 - 8.1 g/dL 6.4     Assessment/Plan:  Counseled patient that Tremfya is a IL-23 inhibitor.  Counseled patient on purpose, proper use, and adverse effects of Tremfya.  Reviewed the most common adverse effects including infection, URTIs, injection site reactions, nausea/diarrhea, and recurrence of tinea and HSV infections Counseled patient that Tremfya should be held for infection and prior to scheduled surgery.  Recommend annual influenza, PCV 15 or PCV20 or Pneumovax 23, and Shingrix as indicated.  Reviewed the importance of regular labs while on Tremfya therapy.  Will monitor CBC and CMP 1 month after  starting and then every 3 months routinely thereafter. Will monitor TB gold annually. Standing orders placed.  Provided patient with medication education material and answered all questions.  Patient voiced understanding.  Patient consented to [facility].  Will upload consent into the media tab.  Reviewed storage instructions of Tremfya.  Advised initial injection must be administered in office.  Patient verbalized understanding.  Will apply for Tremfya through patient's insurance and update when we receive a response.  Dose will be Tremfya 100 mg on weeks 0 and 4 then every 8 weeks thereafter.  Prescription pending lab results and insurance approval.  We also reviewed Skyrizi in detail but patient elected to try for Tremfya first pending insurance preferred options.  Chesley Mires, PharmD, MPH, BCPS, CPP Clinical Pharmacist (Rheumatology and Pulmonology)

## 2022-06-24 NOTE — Patient Instructions (Signed)
Guselkumab Injection What is this medication? GUSELKUMAB (goo ZELK ue mab) treats autoimmune conditions, such as arthritis and psoriasis. It works by slowing down an overactive immune system. It is a monoclonal antibody. This medicine may be used for other purposes; ask your health care provider or pharmacist if you have questions. COMMON BRAND NAME(S): Tremfya What should I tell my care team before I take this medication? They need to know if you have any of these conditions: Immune system problems Infection, such as a virus infection, chickenpox, cold sores, herpes, or a history of infections Recently received or scheduled to receive a vaccine Tuberculosis, a positive skin test for tuberculosis, or recent close contact with someone who has tuberculosis An unusual or allergic reaction to guselkumab, other medications, foods, dyes, or preservatives Pregnant or trying to get pregnant Breast-feeding How should I use this medication? This medication is injected under the skin. It is usually given by a care team in a hospital or clinic setting. It may also be given at home. If you get this medication at home, you will be taught how to prepare and give it. Use exactly as directed. Take it as directed on the prescription label. Keep taking it unless your care team tells you to stop. It is important that you put your used needles and syringes in a special sharps container. Do not put them in a trash can. If you do not have a sharps container, call your pharmacist or care team to get one. A special MedGuide will be given to you by the pharmacist with each prescription and refill. Be sure to read this information carefully each time. Talk to your care team about the use of this medication in children. Special care may be needed. Overdosage: If you think you have taken too much of this medicine contact a poison control center or emergency room at once. NOTE: This medicine is only for you. Do not share  this medicine with others. What if I miss a dose? If you get this medication at a hospital or clinic: It is important not to miss your dose. Call your care team if you are unable to keep an appointment. If you give yourself this medication at home: If you miss a dose, take it as soon as you can. Then continue your normal schedule. If it is almost time for your next dose, take only that dose. Do not take double or extra doses. Call your care team with questions. What may interact with this medication? Do not take this medication with any of the following: Live virus vaccines This medication may also interact with the following: Amoxapine Certain medications for depression, anxiety, or mental health conditions, such as amitriptyline, clomipramine, desipramine, doxepin, imipramine, maprotiline, nortriptyline, protriptyline, trimipramine Codeine Inactivated vaccines Methadone Pimozide Thioridazine This list may not describe all possible interactions. Give your health care provider a list of all the medicines, herbs, non-prescription drugs, or dietary supplements you use. Also tell them if you smoke, drink alcohol, or use illegal drugs. Some items may interact with your medicine. What should I watch for while using this medication? Your condition will be monitored carefully while you are receiving this medication. Tell your care team if your symptoms do not start to get better or if they get worse. You will be tested for tuberculosis (TB) before you start this medication. If your care team prescribes any medication for TB, you should start taking the TB medication before starting this medication. Make sure to finish the full   course of TB medication. This medication may increase your risk of getting an infection. Call your care team for advice if you get a fever, chills, sore throat, or other symptoms of a cold or flu. Do not treat yourself. Try to avoid being around people who are sick. What side  effects may I notice from receiving this medication? Side effects that you should report to your care team as soon as possible: Allergic reactions--skin rash, itching, hives, swelling of the face, lips, tongue, or throat Infection--fever, chills, cough, sore throat, wounds that don't heal, pain or trouble when passing urine, general feeling of discomfort or being unwell Side effects that usually do not require medical attention (report to your care team if they continue or are bothersome): Diarrhea Headache Joint pain Pain, redness, or irritation at injection site Runny or stuffy nose Sore throat This list may not describe all possible side effects. Call your doctor for medical advice about side effects. You may report side effects to FDA at 1-800-FDA-1088. Where should I keep my medication? Keep out of the reach of children and pets. Store in the refrigerator. Do not freeze. Keep it in the original container until you are ready to use. Protect from light. Do not shake. Remove the dose from the refrigerator about 30 minutes prior to use. Get rid of any unused medication after the expiration date on the label. To get rid of medications that are no longer needed or have expired: Take the medication to a medication take-back program. Check with your pharmacy or law enforcement to find a location. If you cannot return the medication, ask your pharmacist or care team how to get rid of this medication safely. NOTE: This sheet is a summary. It may not cover all possible information. If you have questions about this medicine, talk to your doctor, pharmacist, or health care provider.  2023 Elsevier/Gold Standard (2021-12-24 00:00:00)  

## 2022-06-25 NOTE — Telephone Encounter (Signed)
Received notification from [facility] regarding a prior authorization for [facility]. Authorization has been APPROVED from 06/25/2022 to 06/26/2023.   Patient must fill through [facility]: [phone]  Authorization #  [id number]   Enrolled patient into Tremfya copay card: BIN: [id number] Group: [id number] ID: [id number]  Scheduling of new start visit is pending baseline labs to result  Chesley Mires, PharmD, MPH, BCPS, CPP Clinical Pharmacist (Rheumatology and Pulmonology)

## 2022-06-25 NOTE — Telephone Encounter (Signed)
Clinical questions submitted to [facility] for Tremfya. Will await response  Chesley Mires, PharmD, MPH, BCPS, CPP Clinical Pharmacist (Rheumatology and Pulmonology)

## 2022-06-25 NOTE — Telephone Encounter (Signed)
Submitted a Prior Authorization request to [facility] for [facility] via CoverMyMeds. Pending clinical questions to populate.  Key: Paula Compton, PharmD, MPH, BCPS, CPP Clinical Pharmacist (Rheumatology and Pulmonology)

## 2022-06-30 ENCOUNTER — Ambulatory Visit (INDEPENDENT_AMBULATORY_CARE_PROVIDER_SITE_OTHER): Payer: BC Managed Care – PPO | Admitting: Sports Medicine

## 2022-06-30 DIAGNOSIS — K580 Irritable bowel syndrome with diarrhea: Secondary | ICD-10-CM | POA: Diagnosis not present

## 2022-06-30 DIAGNOSIS — M899 Disorder of bone, unspecified: Secondary | ICD-10-CM | POA: Diagnosis not present

## 2022-06-30 DIAGNOSIS — L4050 Arthropathic psoriasis, unspecified: Secondary | ICD-10-CM

## 2022-06-30 DIAGNOSIS — Z0000 Encounter for general adult medical examination without abnormal findings: Secondary | ICD-10-CM

## 2022-06-30 LAB — IGG, IGA, IGM
IgG (Immunoglobin G), Serum: 832 mg/dL (ref 600–1640)
IgM, Serum: 169 mg/dL (ref 50–300)
Immunoglobulin A: 276 mg/dL (ref 47–310)

## 2022-06-30 LAB — PROTEIN ELECTROPHORESIS, SERUM, WITH REFLEX
Albumin ELP: 4.1 g/dL (ref 3.8–4.8)
Alpha 1: 0.3 g/dL (ref 0.2–0.3)
Alpha 2: 0.6 g/dL (ref 0.5–0.9)
Beta 2: 0.3 g/dL (ref 0.2–0.5)
Beta Globulin: 0.5 g/dL (ref 0.4–0.6)
Gamma Globulin: 0.8 g/dL (ref 0.8–1.7)
Total Protein: 6.6 g/dL (ref 6.1–8.1)

## 2022-06-30 LAB — GLUCOSE 6 PHOSPHATE DEHYDROGENASE: G-6PDH: 13.9 U/g Hgb (ref 7.0–20.5)

## 2022-06-30 LAB — QUANTIFERON-TB GOLD PLUS
Mitogen-NIL: >10 IU/mL
NIL: 0.03 IU/mL
QuantiFERON-TB Gold Plus: NEGATIVE
TB1-NIL: 0.03 IU/mL
TB2-NIL: 0.04 IU/mL

## 2022-06-30 LAB — HEPATITIS C ANTIBODY: Hepatitis C Ab: NONREACTIVE

## 2022-06-30 LAB — HEPATITIS B SURFACE ANTIGEN: Hepatitis B Surface Ag: NONREACTIVE

## 2022-06-30 LAB — HEPATITIS B CORE ANTIBODY, IGM: Hep B C IgM: NONREACTIVE

## 2022-06-30 MED ORDER — CLOBETASOL PROPIONATE 0.05 % EX CREA: 1.0000 | TOPICAL_CREAM | Freq: Two times a day (BID) | CUTANEOUS | 0 refills | Status: DC

## 2022-06-30 MED ORDER — HYOSCYAMINE SULFATE ER 0.375 MG PO TB12: ORAL_TABLET | ORAL | 11 refills | Status: AC

## 2022-06-30 NOTE — Assessment & Plan Note (Signed)
Annual physical as above per Checking routine labs.

## 2022-06-30 NOTE — Assessment & Plan Note (Signed)
Currently being evaluated for Tremfya. Adding topical clobetasol, if insufficient improvement after a month we will switch to calcipotriene. She also had circular scaly rash left upper inner arm, question tinea corporis, if insufficient improvement with the antifungal we will likely need to switch to topical terbinafine.

## 2022-06-30 NOTE — Assessment & Plan Note (Signed)
Pleasant 36 year old female, history of psoriasis, currently being evaluated for Tremfya with rheumatology. She has had a long history of anterior pelvic pain, now 6 weeks in spite of conservative treatment. Tenderness is directly on the left pubic bone. Hip exam is completely unrevealing. X-rays unrevealing. Prednisone was helpful, but pain returned when prednisone ended. At this point due to failure of 6 weeks of conservative treatment we will proceed with left hip/pelvic MRI. Endometriosis is in the differential, we did discuss the potential using birth control/COC's. If all else fails then I would recommend that we have her start the Tremfya for psoriatic arthritis.

## 2022-06-30 NOTE — Progress Notes (Signed)
Subjective:    CC: Annual Physical Exam  HPI:  This patient is here for their annual physical  I reviewed the past medical history, family history, social history, surgical history, and allergies today and no changes were needed.  Please see the problem list section below in epic for further details.  Past Medical History: No past medical history on file. Past Surgical History: No past surgical history on file. Social History: Social History   Socioeconomic History   Marital status: Married    Spouse name: Not on file   Number of children: Not on file   Years of education: Not on file   Highest education level: Not on file  Occupational History   Not on file  Tobacco Use   Smoking status: Never    Passive exposure: Never   Smokeless tobacco: Never  Vaping Use   Vaping Use: Never used  Substance and Sexual Activity   Alcohol use: Yes    Comment: occ   Drug use: No   Sexual activity: Yes  Other Topics Concern   Not on file  Social History Narrative   Not on file   Social Determinants of Health   Financial Resource Strain: Not on file  Food Insecurity: Not on file  Transportation Needs: Not on file  Physical Activity: Not on file  Stress: Not on file  Social Connections: Not on file   Family History: Family History  Problem Relation Age of Onset   Depression Mother    Depression Father    Diabetes Father    Hypertension Father    Stroke Father    Heart disease Father    Cancer Maternal Uncle    Heart attack Paternal Uncle    Cancer Paternal Grandfather    Heart attack Paternal Grandfather    Hypertension Paternal Grandfather    Allergies: No Known Allergies Medications: See med rec.  Review of Systems: No headache, visual changes, nausea, vomiting, diarrhea, constipation, dizziness, abdominal pain, skin rash, fevers, chills, night sweats, swollen lymph nodes, weight loss, chest pain, body aches, joint swelling, muscle aches, shortness of breath, mood  changes, visual or auditory hallucinations.  Objective:    General: Well Developed, well nourished, and in no acute distress.  Neuro: Alert and oriented x3, extra-ocular muscles intact, sensation grossly intact. Cranial nerves II through XII are intact, motor, sensory, and coordinative functions are all intact. HEENT: Normocephalic, atraumatic, pupils equal round reactive to light, neck supple, no masses, no lymphadenopathy, thyroid nonpalpable. Oropharynx, nasopharynx, external ear canals are unremarkable. Skin: Warm and dry, no rashes noted.  Cardiac: Regular rate and rhythm, no murmurs rubs or gallops.  Respiratory: Clear to auscultation bilaterally. Not using accessory muscles, speaking in full sentences.  Abdominal: Soft, nontender, nondistended, positive bowel sounds, no masses, no organomegaly.  Musculoskeletal: Shoulder, elbow, wrist, hip, knee, ankle stable, and with full range of motion.  Impression and Recommendations:    The patient was counselled, risk factors were discussed, anticipatory guidance given.  Pubic bone pain Alice Johnston 36 year old female, history of psoriasis, currently being evaluated for Tremfya with rheumatology. She has had a long history of anterior pelvic pain, now 6 weeks in spite of conservative treatment. Tenderness is directly on the left pubic bone. Hip exam is completely unrevealing. X-rays unrevealing. Prednisone was helpful, but pain returned when prednisone ended. At this point due to failure of 6 weeks of conservative treatment we will proceed with left hip/pelvic MRI. Endometriosis is in the differential, we did discuss the potential using  birth control/COC's. If all else fails then I would recommend that we have her start the Tremfya for psoriatic arthritis.  Annual physical exam Annual physical as above per Checking routine labs.  Psoriatic arthritis (HCC) Currently being evaluated for Tremfya. Adding topical clobetasol, if insufficient  improvement after a month we will switch to calcipotriene. She also had circular scaly rash left upper inner arm, question tinea corporis, if insufficient improvement with the antifungal we will likely need to switch to topical terbinafine.  ____________________________________________ Ihor Austin. Benjamin Stain, M.D., ABFM., CAQSM., AME. Primary Care and Sports Medicine [facility]  Adjunct Professor of Family Medicine  [employer]  Restaurant manager, fast food

## 2022-07-01 LAB — CBC
HCT: 43.2 % (ref 35.0–45.0)
Hemoglobin: 14.4 g/dL (ref 11.7–15.5)
MCH: 33 pg (ref 27.0–33.0)
MCHC: 33.3 g/dL (ref 32.0–36.0)
MCV: 98.9 fL (ref 80.0–100.0)
MPV: 10.3 fL (ref 7.5–12.5)
Platelets: 259 10*3/uL (ref 140–400)
RBC: 4.37 10*6/uL (ref 3.80–5.10)
RDW: 12.1 % (ref 11.0–15.0)
WBC: 5.3 10*3/uL (ref 3.8–10.8)

## 2022-07-01 LAB — COMPLETE METABOLIC PANEL WITH GFR
AG Ratio: 1.7 (calc) (ref 1.0–2.5)
ALT: 15 U/L (ref 6–29)
AST: 15 U/L (ref 10–30)
Albumin: 4.3 g/dL (ref 3.6–5.1)
Alkaline phosphatase (APISO): 50 U/L (ref 31–125)
BUN: 13 mg/dL (ref 7–25)
CO2: 28 mmol/L (ref 20–32)
Calcium: 9.4 mg/dL (ref 8.6–10.2)
Chloride: 105 mmol/L (ref 98–110)
Creat: 0.77 mg/dL (ref 0.50–0.97)
Globulin: 2.6 g/dL (calc) (ref 1.9–3.7)
Glucose, Bld: 79 mg/dL (ref 65–139)
Potassium: 4.7 mmol/L (ref 3.5–5.3)
Sodium: 139 mmol/L (ref 135–146)
Total Bilirubin: 0.5 mg/dL (ref 0.2–1.2)
Total Protein: 6.9 g/dL (ref 6.1–8.1)
eGFR: 102 mL/min/{1.73_m2} (ref >=60)

## 2022-07-01 LAB — LIPID PANEL
Cholesterol: 207 mg/dL — ABNORMAL HIGH (ref <200)
HDL: 81 mg/dL (ref >=50)
LDL Cholesterol (Calc): 103 mg/dL (calc) — ABNORMAL HIGH
Non-HDL Cholesterol (Calc): 126 mg/dL (calc) (ref <130)
Total CHOL/HDL Ratio: 2.6 (calc) (ref <5.0)
Triglycerides: 124 mg/dL (ref <150)

## 2022-07-01 LAB — TSH: TSH: 0.99 mIU/L

## 2022-07-01 NOTE — Telephone Encounter (Signed)
Spoke with patient regarding Tremfya approval. She states she is waiting on MRI that has not yet been scheduled by PCP office. She is also completing another 2-3 weeks course of antifungal cream for ringworm.  Will f/u in 2-3 weeks with patient  Chesley Mires, PharmD, MPH, BCPS, CPP Clinical Pharmacist (Rheumatology and Pulmonology)

## 2022-07-05 ENCOUNTER — Ambulatory Visit (INDEPENDENT_AMBULATORY_CARE_PROVIDER_SITE_OTHER): Payer: BC Managed Care – PPO

## 2022-07-05 DIAGNOSIS — M25552 Pain in left hip: Secondary | ICD-10-CM | POA: Diagnosis not present

## 2022-07-05 DIAGNOSIS — M899 Disorder of bone, unspecified: Secondary | ICD-10-CM

## 2022-07-22 NOTE — Progress Notes (Deleted)
Office Visit Note  Patient: Alice Johnston             Date of Birth: Feb 17, 1986           MRN: [id number]             PCP: Silverio Decamp, MD Referring: Silverio Decamp,* Visit Date: 07/31/2022 Occupation: @GUAROCC @  Subjective:  No chief complaint on file.   History of Present Illness: [name] is a 36 y.o. female ***   Activities of Daily Living:  Patient reports morning stiffness for *** {minute/hour:19697}.   Patient {ACTIONS;DENIES/REPORTS:21021675::"Denies"} nocturnal pain.  Difficulty dressing/grooming: {ACTIONS;DENIES/REPORTS:21021675::"Denies"} Difficulty climbing stairs: {ACTIONS;DENIES/REPORTS:21021675::"Denies"} Difficulty getting out of chair: {ACTIONS;DENIES/REPORTS:21021675::"Denies"} Difficulty using hands for taps, buttons, cutlery, and/or writing: {ACTIONS;DENIES/REPORTS:21021675::"Denies"}  No Rheumatology ROS completed.   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  Family History  Problem Relation Age of Onset   Depression Mother    Depression Father    Diabetes Father    Hypertension Father    Stroke Father    Heart disease Father    Cancer Maternal Uncle    Heart attack Paternal Uncle    Cancer Paternal Grandfather    Heart attack Paternal Grandfather    Hypertension Paternal Grandfather    No past surgical history on file. Social History   Social History Narrative   Not on file    There is no immunization history on file for this patient.   Objective: Vital Signs: There were no vitals taken for this visit.   Physical Exam   Musculoskeletal Exam: ***  CDAI  Exam: CDAI Score: -- Patient Global: --; Provider Global: -- Swollen: --; Tender: -- Joint Exam 07/31/2022   No joint exam has been documented for this visit   There is currently no information documented on the homunculus. Go to the Rheumatology activity and complete the homunculus joint exam.  Investigation: No additional findings.  Imaging: MR HIP LEFT WO CONTRAST  Result Date: 07/06/2022 CLINICAL DATA:  Left anterior hip pain for the past 2 months. No injury or prior surgery. EXAM: MR OF THE LEFT HIP WITHOUT CONTRAST TECHNIQUE: Multiplanar, multisequence MR imaging was performed. No intravenous contrast was administered. COMPARISON:  Left hip x-rays dated June 02, 2022. FINDINGS: Bones: There is no evidence of acute fracture, dislocation or avascular necrosis. No focal bone lesion. The visualized sacroiliac joints and symphysis pubis appear normal. Articular cartilage and labrum Articular cartilage: No focal chondral defect or subchondral signal abnormality identified. Labrum: Grossly intact, although evaluation is limited due to lack of intra-articular fluid. No paralabral abnormality. Joint or bursal effusion Joint effusion: No significant hip joint effusion. Bursae: No focal periarticular fluid collection. Muscles and tendons Muscles and tendons: The visualized gluteus, hamstring and iliopsoas tendons appear normal. No muscle edema or atrophy. Other findings Miscellaneous: The visualized internal pelvic contents appear unremarkable. IMPRESSION: 1. Normal MRI of the left hip. No explanation for the patient's symptoms. Electronically Signed   By: Titus Dubin M.D.   On: 07/06/2022 17:19   XR Foot 2 Views Left  Result Date: 06/24/2022 No MTP, PIP or DIP narrowing was noted.  No intertarsal, tibiotalar or subtalar joint space narrowing was noted.  Inferior calcaneal spur was noted. Impression: Early degenerative changes were noted.  XR Foot 2 Views Right  Result Date: 06/24/2022 No  MTP, PIP or DIP narrowing was noted.  No intertarsal, tibiotalar or subtalar joint space narrowing was noted.  Inferior calcaneal spur was noted. Impression: Early degenerative changes were noted.  XR Hand 2 View Left  Result Date: 06/24/2022 No CMC, PIP or DIP narrowing was noted.  No MCP, intercarpal or radiocarpal joint space narrowing was noted.  No erosive changes were noted. Impression: Unremarkable x-ray of the hand.  XR Hand 2 View Right  Result Date: 06/24/2022 No CMC, PIP or DIP narrowing was noted.  No MCP, intercarpal or radiocarpal joint space narrowing was noted.  No erosive changes were noted. Impression: Unremarkable x-ray of the hand.   Recent Labs: Lab Results  Component Value Date   WBC 5.3 06/30/2022   HGB 14.4 06/30/2022   PLT 259 06/30/2022   NA 139 06/30/2022   K 4.7 06/30/2022   CL 105 06/30/2022   CO2 28 06/30/2022   GLUCOSE 79 06/30/2022   BUN 13 06/30/2022   CREATININE 0.77 06/30/2022   BILITOT 0.5 06/30/2022   ALKPHOS 36 (L) 06/14/2014   AST 15 06/30/2022   ALT 15 06/30/2022   PROT 6.9 06/30/2022   ALBUMIN 4.5 06/14/2014   CALCIUM 9.4 06/30/2022   GFRAA 111 05/24/2018   QFTBGOLDPLUS NEGATIVE 06/24/2022   June 24, 2022 SPEP normal, immunoglobulins normal hepatitis B negative, hepatitis C negative, TB Gold negative, G6PD normal   Speciality Comments: No specialty comments available.  Procedures:  No procedures performed Allergies: Patient has no known allergies.   Assessment / Plan:     Visit Diagnoses: No diagnosis found.  Orders: No orders of the defined types were placed in this encounter.  No orders of the defined types were placed in this encounter.   Face-to-face time spent with patient was *** minutes. Greater than 50% of time was spent in counseling and coordination of care.  Follow-Up Instructions: No follow-ups on file.   Bo Merino, MD  Note - This record has been created using Editor, commissioning.  Chart creation  errors have been sought, but may not always  have been located. Such creation errors do not reflect on  the standard of medical care.

## 2022-07-27 ENCOUNTER — Ambulatory Visit: Payer: BC Managed Care – PPO | Admitting: Rheumatology

## 2022-07-27 ENCOUNTER — Ambulatory Visit (INDEPENDENT_AMBULATORY_CARE_PROVIDER_SITE_OTHER): Payer: BC Managed Care – PPO | Admitting: Sports Medicine

## 2022-07-27 ENCOUNTER — Encounter: Payer: Self-pay | Admitting: Sports Medicine

## 2022-07-27 DIAGNOSIS — N946 Dysmenorrhea, unspecified: Secondary | ICD-10-CM

## 2022-07-27 MED ORDER — NORGESTIMATE-ETH ESTRADIOL 0.25-35 MG-MCG PO TABS: 1.0000 | ORAL_TABLET | Freq: Every day | ORAL | 11 refills | Status: AC

## 2022-07-27 NOTE — Progress Notes (Addendum)
    Procedures performed today:    None.  Independent interpretation of notes and tests performed by another provider:   None.  Brief History, Exam, Impression, and Recommendations:    Dysmenorrhea This is a very pleasant 36 year old female, she has chronic left lower pelvic pain, particularly worse around her cycles, she was being evaluated for [facility] with rheumatology as she does have a history of psoriasis. We have had some difficulty pinning down what is causing her pain, she did have some tenderness directly over the left pubic bone, hip exam however was completely unrevealing, x-rays and hip/pelvis MRI were unrevealing. Prednisone was helpful but pain of course returned when prednisone ended, we are suspecting more of a gynecologic origin to her pain, adding combination oral contraception, will do this for 2 months and then revisit.  Of note she did have a rash that appeared to be tinea corporis left upper inner arm that has is mostly resolved after topical antifungal.  Chronic process not at goal with pharmacologic intervention  ____________________________________________ Ihor Austin. Benjamin Stain, M.D., ABFM., CAQSM., AME. Primary Care and Sports Medicine [place] [facility]  Adjunct Professor of Family Medicine  [employer]  Restaurant manager, fast food

## 2022-07-27 NOTE — Assessment & Plan Note (Addendum)
This is a very pleasant 36 year old female, she has chronic left lower pelvic pain, particularly worse around her cycles, she was being evaluated for [facility] with rheumatology as she does have a history of psoriasis. We have had some difficulty pinning down what is causing her pain, she did have some tenderness directly over the left pubic bone, hip exam however was completely unrevealing, x-rays and hip/pelvis MRI were unrevealing. Prednisone was helpful but pain of course returned when prednisone ended, we are suspecting more of a gynecologic origin to her pain, adding combination oral contraception, will do this for 2 months and then revisit.  Of note she did have a rash that appeared to be tinea corporis left upper inner arm that has is mostly resolved after topical antifungal.

## 2022-07-31 ENCOUNTER — Ambulatory Visit: Payer: BC Managed Care – PPO | Admitting: Rheumatology

## 2022-07-31 DIAGNOSIS — R152 Fecal urgency: Secondary | ICD-10-CM

## 2022-07-31 DIAGNOSIS — M722 Plantar fascial fibromatosis: Secondary | ICD-10-CM

## 2022-07-31 DIAGNOSIS — G43809 Other migraine, not intractable, without status migrainosus: Secondary | ICD-10-CM

## 2022-07-31 DIAGNOSIS — F909 Attention-deficit hyperactivity disorder, unspecified type: Secondary | ICD-10-CM

## 2022-07-31 DIAGNOSIS — Z79899 Other long term (current) drug therapy: Secondary | ICD-10-CM

## 2022-07-31 DIAGNOSIS — F411 Generalized anxiety disorder: Secondary | ICD-10-CM

## 2022-07-31 DIAGNOSIS — Z8719 Personal history of other diseases of the digestive system: Secondary | ICD-10-CM

## 2022-07-31 DIAGNOSIS — M7062 Trochanteric bursitis, left hip: Secondary | ICD-10-CM

## 2022-07-31 DIAGNOSIS — B354 Tinea corporis: Secondary | ICD-10-CM

## 2022-07-31 DIAGNOSIS — N946 Dysmenorrhea, unspecified: Secondary | ICD-10-CM

## 2022-07-31 DIAGNOSIS — K580 Irritable bowel syndrome with diarrhea: Secondary | ICD-10-CM

## 2022-07-31 DIAGNOSIS — L4050 Arthropathic psoriasis, unspecified: Secondary | ICD-10-CM

## 2022-07-31 DIAGNOSIS — M79641 Pain in right hand: Secondary | ICD-10-CM

## 2022-07-31 DIAGNOSIS — M79671 Pain in right foot: Secondary | ICD-10-CM

## 2022-07-31 DIAGNOSIS — L409 Psoriasis, unspecified: Secondary | ICD-10-CM

## 2022-07-31 DIAGNOSIS — M25552 Pain in left hip: Secondary | ICD-10-CM

## 2022-07-31 DIAGNOSIS — M899 Disorder of bone, unspecified: Secondary | ICD-10-CM

## 2022-08-12 NOTE — Progress Notes (Signed)
Office Visit Note  Patient: Alice Johnston             Date of Birth: 12/27/1985           MRN: [id number]             PCP: Monica Becton, MD Referring: Monica Becton,* Visit Date: 08/21/2022 Occupation: @GUAROCC @  Subjective:  Pain in joints and psoriasis  History of Present Illness: Alice Johnston is a 36 y.o. female with psoriatic arthritis and psoriasis.  She states she has episodic pain in the pubic region and in the left hip region which she describes to when she has a very Alica spine.  She has had episodic problems with planter fasciitis.  She continues to have pain and swelling in her right index finger.  She has been using clobetasol intermittently on her elbows she has not noticed improvement in the psoriasis.  She states that the tinea corporis region responded to terbinafine topical agent prescribed by Dr. 31.  Patient states she has been thinking about Tremfya and she is not completely convinced to restart the medication as her symptoms are intermittent.  She would like to wait until next year to start the injections.  Activities of Daily Living:  Patient reports morning stiffness for 0 minutes.   Patient Reports nocturnal pain.  Difficulty dressing/grooming: Denies Difficulty climbing stairs: Denies Difficulty getting out of chair: Denies Difficulty using hands for taps, buttons, cutlery, and/or writing: Denies  Review of Systems  Constitutional:  Positive for fatigue.  HENT:  Negative for mouth sores and mouth dryness.   Eyes:  Negative for dryness.  Respiratory:  Negative for shortness of breath.   Cardiovascular:  Negative for chest pain and palpitations.  Gastrointestinal:  Positive for constipation and diarrhea. Negative for blood in stool.  Endocrine: Negative for increased urination.  Genitourinary:  Negative for involuntary urination.  Musculoskeletal:  Positive for joint pain, joint pain, joint swelling, myalgias and myalgias. Negative for  gait problem, muscle weakness, morning stiffness and muscle tenderness.  Skin:  Positive for rash. Negative for color change, hair loss and sensitivity to sunlight.  Allergic/Immunologic: Negative for susceptible to infections.  Neurological:  Negative for dizziness and headaches.  Hematological:  Negative for swollen glands.  Psychiatric/Behavioral:  Negative for depressed mood and sleep disturbance. The patient is not nervous/anxious.     PMFS History:  Patient Active Problem List   Diagnosis Date Noted   Psoriatic arthritis (HCC) 06/30/2022   Dysmenorrhea 06/02/2022   Adult ADHD 06/27/2021   Plantar fasciitis, left 04/25/2021   Migraine headache 06/16/2016   Generalized anxiety disorder 12/12/2015   Irritable bowel syndrome 06/14/2014   Annual physical exam 06/14/2014   Allergic rhinitis 07/19/2012    Past Medical History:  Diagnosis Date   Psoriasis    Psoriatic arthropathy (HCC)     Family History  Problem Relation Age of Onset   Depression Mother    Depression Father    Diabetes Father    Hypertension Father    Stroke Father    Heart disease Father    Cancer Maternal Uncle    Heart attack Paternal Uncle    Cancer Paternal Grandfather    Heart attack Paternal Grandfather    Hypertension Paternal Grandfather    History reviewed. No pertinent surgical history. Social History   Social History Narrative   Not on file    There is no immunization history on file for this patient.   Objective: Vital Signs: BP 107/75 (  BP Location: Left Arm, Patient Position: Sitting, Cuff Size: Normal)   Pulse 74   Resp 14   Ht 5\' 5"  (1.651 m)   Wt 144 lb 6.4 oz (65.5 kg)   BMI 24.03 kg/m    Physical Exam Vitals and nursing note reviewed.  Constitutional:      Appearance: She is well-developed.  HENT:     Head: Normocephalic and atraumatic.  Eyes:     Conjunctiva/sclera: Conjunctivae normal.  Cardiovascular:     Rate and Rhythm: Normal rate and regular rhythm.      Heart sounds: Normal heart sounds.  Pulmonary:     Effort: Pulmonary effort is normal.     Breath sounds: Normal breath sounds.  Abdominal:     General: Bowel sounds are normal.     Palpations: Abdomen is soft.  Musculoskeletal:     Cervical back: Normal range of motion.  Lymphadenopathy:     Cervical: No cervical adenopathy.  Skin:    General: Skin is warm and dry.     Capillary Refill: Capillary refill takes less than 2 seconds.     Comments: Psoriasis patches were noted over bilateral elbows.  A small patch was noted over left arm.  Postinflammatory hyperpigmentation was noted over the left arm.  Neurological:     Mental Status: She is alert and oriented to person, place, and time.  Psychiatric:        Behavior: Behavior normal.      Musculoskeletal Exam: Cervical thoracic and lumbar spine were in good range of motion.  She had no SI joint tenderness.  Shoulder joints, elbow joints, wrist joints with good range of motion.  She has swelling and synovitis of her right second PIP joint.  Hip joints with good range of motion.  She has some tenderness on palpation of her anterior superior helical spine.  Hip joints and knee joints with good range of motion.  She had no tenderness over ankles or MTPs or plantar fascia.  CDAI Exam: CDAI Score: -- Patient Global: --; Provider Global: -- Swollen: --; Tender: -- Joint Exam 08/21/2022   No joint exam has been documented for this visit   There is currently no information documented on the homunculus. Go to the Rheumatology activity and complete the homunculus joint exam.  Investigation: No additional findings.  Imaging: No results found.  Recent Labs: Lab Results  Component Value Date   WBC 5.3 06/30/2022   HGB 14.4 06/30/2022   PLT 259 06/30/2022   NA 139 06/30/2022   K 4.7 06/30/2022   CL 105 06/30/2022   CO2 28 06/30/2022   GLUCOSE 79 06/30/2022   BUN 13 06/30/2022   CREATININE 0.77 06/30/2022   BILITOT 0.5 06/30/2022    ALKPHOS 36 (L) 06/14/2014   AST 15 06/30/2022   ALT 15 06/30/2022   PROT 6.9 06/30/2022   ALBUMIN 4.5 06/14/2014   CALCIUM 9.4 06/30/2022   GFRAA 111 05/24/2018   QFTBGOLDPLUS NEGATIVE 06/24/2022    Speciality Comments: No specialty comments available.  Procedures:  No procedures performed Allergies: Patient has no known allergies.   Assessment / Plan:     Visit Diagnoses: Psoriatic arthropathy ([place]) - History of recurrent Planter fasciitis, trochanteric bursitis, left hip pain, pubic pain, and right second PIP synovitis.  Discussed Tremfya at the last visit.  Patient is still apprehensive about restarting Tremfya.  I discussed other treatment options including sulfasalazine, methotrexate and Otezla which she did not like the side effects.  Tremfya has been  approved by the insurance.  Patient states she is not ready to start Tremfya until next year.  She had several questions regarding Tremfya and its use.  We had detailed discussion regarding the use of Tremfya and its side effects again.  All the questions were answered.  Psoriasis - Psoriasis patches over bilateral elbows and few scales on her face.  She has a prescription for clobetasol which she uses intermittently.  She still had psoriasis patches over her elbows.  High risk medication use-Labs obtained at the last visit were discussed.  Patient will decide when she will start Tremfya.June 24, 2022 SPEP negative, immunoglobulins normal G6PD normal, hepatitis B negative, hepatitis C negative, TB Gold negative.  Lab results were discussed with the patient.  I advised patient to get labs in a month and every 3 months after starting Tremfya.  Information regarding the vaccines were also placed in the AVS.  She was advised to hold Tremfya if she develops an infection resume after the infection resolves.  Pain in both hands - Right second PIP synovitis was noted at the last visit which is a still present.  X-rays were unremarkable.  I  discussed the aggressive nature of psoriatic arthritis and deterioration of joint if not treated aggressively.  Patient voiced understanding.  Pain in left hip - Ongoing pain for the last several weeks.  She responded to prednisone taper and the symptoms recurred after prednisone taper.  She has history of chronic pubic pain.  She states the pubic pain has resolved currently.  Although she continues to have some discomfort in her left hip.  She had mild tenderness over anterior superior iliac spine.  Trochanteric bursitis, left hip -she has intermittent discomfort in the left trochanteric bursa.  A handout on IT band stretches was given at the last visit.  Pain in both feet - History of chronic pain.  No synovitis was noted.  X-rays showed early degenerative changes.  Plantar fasciitis, left - History of recurrent planter fasciitis followed by Dr. Benjamin Stain.  Patient states she had a flare last week which is better now.  Pubic bone pain - Chronic pain.  Tinea corporis - 1 lesion was noted over left upper arm.  Inadequate response to topical ketoconazole 2% cream was given.  Treated with topical terbinafine by Dr. Benjamin Stain.  The lesion has resolved.  She has some postinflammatory hyperpigmentation in the area.  Other medical problems are listed as follows:  History of gastroesophageal reflux (GERD)  Irritable bowel syndrome with diarrhea  Other migraine without status migrainosus, not intractable  Adult ADHD  Generalized anxiety disorder  Orders: No orders of the defined types were placed in this encounter.  No orders of the defined types were placed in this encounter.    Follow-Up Instructions: Return in about 4 months (around 12/22/2022) for Psoriatic arthritis.   Pollyann Savoy, MD  Note - This record has been created using Animal nutritionist.  Chart creation errors have been sought, but may not always  have been located. Such creation errors do not reflect on  the  standard of medical care.

## 2022-08-13 NOTE — Telephone Encounter (Signed)
Reached out to patient regarding Tremfya new start. Unable to reach. Left VM requesting return call  Per review of family med OV note from 06/30/22 -  Prednisone was helpful, but pain returned when prednisone ended. At this point due to failure of 6 weeks of conservative treatment we will proceed with left hip/pelvic MRI. Endometriosis is in the differential, we did discuss the potential using birth control/COC's. If all else fails then I would recommend that we have her start the Tremfya for psoriatic arthritis.  MRI on 07/05/22 of left hip showed no abnormalities.   Patient also seen by family med on 07/27/22 - Prednisone was helpful but pain of course returned when prednisone ended, we are suspecting more of a gynecologic origin to her pain, adding combination oral contraception, will do this for 2 months and then revisit.  Knox Saliva, PharmD, MPH, BCPS, CPP Clinical Pharmacist (Rheumatology and Pulmonology)

## 2022-08-21 ENCOUNTER — Ambulatory Visit: Payer: BC Managed Care – PPO | Attending: Rheumatology | Admitting: Rheumatology

## 2022-08-21 ENCOUNTER — Encounter: Payer: Self-pay | Admitting: Rheumatology

## 2022-08-21 VITALS — BP 107/75 | HR 74 | Resp 14 | Ht 65.0 in | Wt 144.4 lb

## 2022-08-21 DIAGNOSIS — M25552 Pain in left hip: Secondary | ICD-10-CM

## 2022-08-21 DIAGNOSIS — Z79899 Other long term (current) drug therapy: Secondary | ICD-10-CM

## 2022-08-21 DIAGNOSIS — F909 Attention-deficit hyperactivity disorder, unspecified type: Secondary | ICD-10-CM

## 2022-08-21 DIAGNOSIS — M7062 Trochanteric bursitis, left hip: Secondary | ICD-10-CM

## 2022-08-21 DIAGNOSIS — M79641 Pain in right hand: Secondary | ICD-10-CM

## 2022-08-21 DIAGNOSIS — M79642 Pain in left hand: Secondary | ICD-10-CM

## 2022-08-21 DIAGNOSIS — L4050 Arthropathic psoriasis, unspecified: Secondary | ICD-10-CM | POA: Diagnosis not present

## 2022-08-21 DIAGNOSIS — M79672 Pain in left foot: Secondary | ICD-10-CM

## 2022-08-21 DIAGNOSIS — L409 Psoriasis, unspecified: Secondary | ICD-10-CM

## 2022-08-21 DIAGNOSIS — Z8719 Personal history of other diseases of the digestive system: Secondary | ICD-10-CM

## 2022-08-21 DIAGNOSIS — M79671 Pain in right foot: Secondary | ICD-10-CM

## 2022-08-21 DIAGNOSIS — B354 Tinea corporis: Secondary | ICD-10-CM

## 2022-08-21 DIAGNOSIS — F411 Generalized anxiety disorder: Secondary | ICD-10-CM

## 2022-08-21 DIAGNOSIS — K580 Irritable bowel syndrome with diarrhea: Secondary | ICD-10-CM

## 2022-08-21 DIAGNOSIS — G43809 Other migraine, not intractable, without status migrainosus: Secondary | ICD-10-CM

## 2022-08-21 DIAGNOSIS — M722 Plantar fascial fibromatosis: Secondary | ICD-10-CM

## 2022-08-21 DIAGNOSIS — M899 Disorder of bone, unspecified: Secondary | ICD-10-CM

## 2022-08-21 NOTE — Telephone Encounter (Signed)
Per review of OV note from today, patient would like to hold on starting Tremfya until new year. Will close encounter for now and follow-up in January 2024  Knox Saliva, PharmD, MPH, BCPS, CPP Clinical Pharmacist (Rheumatology and Pulmonology)

## 2022-08-21 NOTE — Patient Instructions (Signed)
Standing Labs We placed an order today for your standing lab work.   Please have your standing labs drawn in 1 month starting Tremfya and then every 3 months  Please have your labs drawn 2 weeks prior to your appointment so that the provider can discuss your lab results at your appointment.  Please note that you may see your imaging and lab results in [facility] before we have reviewed them. We will contact you once all results are reviewed. Please allow our office up to 72 hours to thoroughly review all of the results before contacting the office for clarification of your results.  Lab hours are:   Monday through Thursday from 8:00 am -12:30 pm and 1:00 pm-5:00 pm and Friday from 8:00 am-12:00 pm.  Please be advised, all patients with office appointments requiring lab work will take precedent over walk-in lab work.   Labs are drawn by [facility]. Please bring your co-pay at the time of your lab draw.  You may receive a bill from [facility] for your lab work.  Please note if you are on Hydroxychloroquine and and an order has been placed for a Hydroxychloroquine level, you will need to have it drawn 4 hours or more after your last dose.  If you wish to have your labs drawn at another location, please call the office 24 hours in advance so we can fax the orders.  The office is located at [address] [place], [place], [place] [address] No appointment is necessary.    If you have any questions regarding directions or hours of operation,  please call [phone].   As a reminder, please drink plenty of water prior to coming for your lab work. Thanks!   Vaccines You are taking a medication(s) that can suppress your immune system.  The following immunizations are recommended: Flu annually Covid-19  Td/Tdap (tetanus, diphtheria, pertussis) every 10 years Pneumonia (Prevnar 15 then Pneumovax 23 at least 1 year apart.  Alternatively, can take Prevnar 20 without needing additional dose) Shingrix:  2 doses from 4 weeks to 6 months apart  Please check with your PCP to make sure you are up to date.   If you have signs or symptoms of an infection or start antibiotics: First, call your PCP for workup of your infection. Hold your medication through the infection, until you complete your antibiotics, and until symptoms resolve if you take the following: Injectable medication (Actemra, Benlysta, Cimzia, Cosentyx, Enbrel, Humira, Kevzara, Orencia, Remicade, Simponi, Stelara, Taltz, Tremfya) Methotrexate Leflunomide (Arava) Mycophenolate (Cellcept) Morrie Sheldon, Olumiant, or Rinvoq

## 2022-09-10 ENCOUNTER — Ambulatory Visit: Payer: BC Managed Care – PPO | Admitting: Rheumatology

## 2022-09-28 ENCOUNTER — Ambulatory Visit: Payer: BC Managed Care – PPO | Admitting: Sports Medicine

## 2022-10-22 ENCOUNTER — Encounter: Payer: Self-pay | Admitting: Sports Medicine

## 2022-11-23 ENCOUNTER — Ambulatory Visit: Payer: BC Managed Care – PPO | Admitting: Sports Medicine

## 2022-12-11 NOTE — Progress Notes (Deleted)
Office Visit Note  Patient: Alice Johnston             Date of Birth: 1986/03/09           MRN: [id number]             PCP: Silverio Decamp, MD Referring: Silverio Decamp,* Visit Date: 12/25/2022 Occupation: @GUAROCC$ @  Subjective:  No chief complaint on file.   History of Present Illness: Alice Johnston is a 37 y.o. female ***     Activities of Daily Living:  Patient reports morning stiffness for *** {minute/hour:19697}.   Patient {ACTIONS;DENIES/REPORTS:21021675::"Denies"} nocturnal pain.  Difficulty dressing/grooming: {ACTIONS;DENIES/REPORTS:21021675::"Denies"} Difficulty climbing stairs: {ACTIONS;DENIES/REPORTS:21021675::"Denies"} Difficulty getting out of chair: {ACTIONS;DENIES/REPORTS:21021675::"Denies"} Difficulty using hands for taps, buttons, cutlery, and/or writing: {ACTIONS;DENIES/REPORTS:21021675::"Denies"}  No Rheumatology ROS completed.   PMFS History:  Patient Active Problem List   Diagnosis Date Noted   Psoriatic arthritis ([place]) 06/30/2022   Dysmenorrhea 06/02/2022   Adult ADHD 06/27/2021   Plantar fasciitis, left 04/25/2021   Migraine headache 06/16/2016   Generalized anxiety disorder 12/12/2015   Irritable bowel syndrome 06/14/2014   Annual physical exam 06/14/2014   Allergic rhinitis 07/19/2012    Past Medical History:  Diagnosis Date   Psoriasis    Psoriatic arthropathy ([place])     Family History  Problem Relation Age of Onset   Depression Mother    Depression Father    Diabetes Father    Hypertension Father    Stroke Father    Heart disease Father    Cancer Maternal Uncle    Heart attack Paternal Uncle    Cancer Paternal Grandfather    Heart attack Paternal Grandfather    Hypertension Paternal Grandfather    No past surgical history on file. Social History   Social History Narrative   Not on file    There is no immunization history on file for this patient.   Objective: Vital Signs: There were no vitals taken for this visit.    Physical Exam   Musculoskeletal Exam: ***  CDAI Exam: CDAI Score: -- Patient Global: --; Provider Global: -- Swollen: --; Tender: -- Joint Exam 12/25/2022   No joint exam has been documented for this visit   There is currently no information documented on the homunculus. Go to the Rheumatology activity and complete the homunculus joint exam.  Investigation: No additional findings.  Imaging: No results found.  Recent Labs: Lab Results  Component Value Date   WBC 5.3 06/30/2022   HGB 14.4 06/30/2022   PLT 259 06/30/2022   NA 139 06/30/2022   K 4.7 06/30/2022   CL 105 06/30/2022   CO2 28 06/30/2022   GLUCOSE 79 06/30/2022   BUN 13 06/30/2022   CREATININE 0.77 06/30/2022   BILITOT 0.5 06/30/2022   ALKPHOS 36 (L) 06/14/2014   AST 15 06/30/2022   ALT 15 06/30/2022   PROT 6.9 06/30/2022   ALBUMIN 4.5 06/14/2014   CALCIUM 9.4 06/30/2022   GFRAA 111 05/24/2018   QFTBGOLDPLUS NEGATIVE 06/24/2022    Speciality Comments: No specialty comments available.  Procedures:  No procedures performed Allergies: Patient has no known allergies.   Assessment / Plan:     Visit Diagnoses: Psoriatic arthropathy ([place])  Psoriasis  High risk medication use  Trochanteric bursitis, left hip  Plantar fasciitis, left  Tinea corporis  History of gastroesophageal reflux (GERD)  Irritable bowel syndrome with diarrhea  Other migraine without status migrainosus, not intractable  Adult ADHD  Generalized anxiety disorder  Orders: No orders of  the defined types were placed in this encounter.  No orders of the defined types were placed in this encounter.   Face-to-face time spent with patient was *** minutes. Greater than 50% of time was spent in counseling and coordination of care.  Follow-Up Instructions: No follow-ups on file.   Ofilia Neas, PA-C  Note - This record has been created using Dragon software.  Chart creation errors have been sought, but may not always   have been located. Such creation errors do not reflect on  the standard of medical care.

## 2022-12-25 ENCOUNTER — Ambulatory Visit: Payer: BC Managed Care – PPO | Admitting: Rheumatology

## 2022-12-25 DIAGNOSIS — G43809 Other migraine, not intractable, without status migrainosus: Secondary | ICD-10-CM

## 2022-12-25 DIAGNOSIS — B354 Tinea corporis: Secondary | ICD-10-CM

## 2022-12-25 DIAGNOSIS — L4050 Arthropathic psoriasis, unspecified: Secondary | ICD-10-CM

## 2022-12-25 DIAGNOSIS — M722 Plantar fascial fibromatosis: Secondary | ICD-10-CM

## 2022-12-25 DIAGNOSIS — L409 Psoriasis, unspecified: Secondary | ICD-10-CM

## 2022-12-25 DIAGNOSIS — F411 Generalized anxiety disorder: Secondary | ICD-10-CM

## 2022-12-25 DIAGNOSIS — F909 Attention-deficit hyperactivity disorder, unspecified type: Secondary | ICD-10-CM

## 2022-12-25 DIAGNOSIS — M7062 Trochanteric bursitis, left hip: Secondary | ICD-10-CM

## 2022-12-25 DIAGNOSIS — Z79899 Other long term (current) drug therapy: Secondary | ICD-10-CM

## 2022-12-25 DIAGNOSIS — Z8719 Personal history of other diseases of the digestive system: Secondary | ICD-10-CM

## 2022-12-25 DIAGNOSIS — K580 Irritable bowel syndrome with diarrhea: Secondary | ICD-10-CM

## 2023-02-08 ENCOUNTER — Encounter: Payer: Self-pay | Admitting: *Deleted

## 2023-05-17 ENCOUNTER — Encounter: Payer: Self-pay | Admitting: Sports Medicine

## 2023-05-17 ENCOUNTER — Ambulatory Visit (INDEPENDENT_AMBULATORY_CARE_PROVIDER_SITE_OTHER): Payer: BC Managed Care – PPO | Admitting: Sports Medicine

## 2023-05-17 VITALS — BP 126/77 | HR 80 | Ht 65.0 in | Wt 135.0 lb

## 2023-05-17 DIAGNOSIS — Z0000 Encounter for general adult medical examination without abnormal findings: Secondary | ICD-10-CM

## 2023-05-17 DIAGNOSIS — F909 Attention-deficit hyperactivity disorder, unspecified type: Secondary | ICD-10-CM

## 2023-05-17 DIAGNOSIS — F411 Generalized anxiety disorder: Secondary | ICD-10-CM

## 2023-05-17 MED ORDER — ALPRAZOLAM 0.5 MG PO TABS: 0.5000 mg | ORAL_TABLET | Freq: Every day | ORAL | 3 refills | Status: DC | PRN

## 2023-05-17 MED ORDER — AMPHETAMINE-DEXTROAMPHETAMINE 10 MG PO TABS: 5.0000 mg | ORAL_TABLET | Freq: Every day | ORAL | 0 refills | Status: DC

## 2023-05-17 NOTE — Progress Notes (Addendum)
    Procedures performed today:    None.  Independent interpretation of notes and tests performed by another provider:   None.  Brief History, Exam, Impression, and Recommendations:    Generalized anxiety disorder Extremely well-controlled with occasional alprazolam.  Annual physical exam Due for Tdap and Pap, she will return for her annual physical at the end of the year. Update: We received records from her OB/GYN, she is due for Pap in 2024.    ____________________________________________ Ihor Austin. Benjamin Stain, M.D., ABFM., CAQSM., AME. Primary Care and Sports Medicine [facility]  Adjunct Professor of Family Medicine  [employer]  Restaurant manager, fast food

## 2023-05-17 NOTE — Assessment & Plan Note (Addendum)
Due for Tdap and Pap, she will return for her annual physical at the end of the year. Update: We received records from her OB/GYN, she is due for Pap in 2024.

## 2023-05-17 NOTE — Assessment & Plan Note (Signed)
Extremely well-controlled with occasional alprazolam.

## 2023-07-23 ENCOUNTER — Encounter: Payer: BC Managed Care – PPO | Admitting: Sports Medicine

## 2023-09-03 ENCOUNTER — Ambulatory Visit (INDEPENDENT_AMBULATORY_CARE_PROVIDER_SITE_OTHER): Payer: BC Managed Care – PPO | Admitting: Sports Medicine

## 2023-09-03 ENCOUNTER — Encounter: Payer: Self-pay | Admitting: Sports Medicine

## 2023-09-03 VITALS — BP 118/72 | HR 61 | Ht 65.0 in | Wt 125.0 lb

## 2023-09-03 DIAGNOSIS — F909 Attention-deficit hyperactivity disorder, unspecified type: Secondary | ICD-10-CM | POA: Diagnosis not present

## 2023-09-03 DIAGNOSIS — F411 Generalized anxiety disorder: Secondary | ICD-10-CM

## 2023-09-03 DIAGNOSIS — K580 Irritable bowel syndrome with diarrhea: Secondary | ICD-10-CM

## 2023-09-03 DIAGNOSIS — Z0000 Encounter for general adult medical examination without abnormal findings: Secondary | ICD-10-CM | POA: Diagnosis not present

## 2023-09-03 DIAGNOSIS — R739 Hyperglycemia, unspecified: Secondary | ICD-10-CM

## 2023-09-03 MED ORDER — TRAZODONE HCL 50 MG PO TABS: ORAL_TABLET | ORAL | 1 refills | Status: DC

## 2023-09-03 MED ORDER — ALPRAZOLAM 0.5 MG PO TABS: 0.5000 mg | ORAL_TABLET | Freq: Every day | ORAL | 3 refills | Status: AC | PRN

## 2023-09-03 MED ORDER — ONDANSETRON 8 MG PO TBDP
8.0000 mg | ORAL_TABLET | Freq: Three times a day (TID) | ORAL | 3 refills | Status: AC | PRN
Start: 2023-09-03 — End: ?

## 2023-09-03 NOTE — Progress Notes (Signed)
Subjective:    CC: Annual Physical Exam  HPI:  This patient is here for their annual physical  I reviewed the past medical history, family history, social history, surgical history, and allergies today and no changes were needed.  Please see the problem list section below in epic for further details.  Past Medical History: Past Medical History:  Diagnosis Date   Psoriasis    Psoriatic arthropathy (HCC)    Past Surgical History: No past surgical history on file. Social History: Social History   Socioeconomic History   Marital status: Married    Spouse name: Not on file   Number of children: Not on file   Years of education: Not on file   Highest education level: Not on file  Occupational History   Not on file  Tobacco Use   Smoking status: Never    Passive exposure: Never   Smokeless tobacco: Never  Vaping Use   Vaping status: Never Used  Substance and Sexual Activity   Alcohol use: Yes    Comment: occ   Drug use: No   Sexual activity: Yes  Other Topics Concern   Not on file  Social History Narrative   Not on file   Social Determinants of Health   Financial Resource Strain: Not on file  Food Insecurity: Not on file  Transportation Needs: Not on file  Physical Activity: Not on file  Stress: Not on file  Social Connections: Not on file   Family History: Family History  Problem Relation Age of Onset   Depression Mother    Depression Father    Diabetes Father    Hypertension Father    Stroke Father    Heart disease Father    Cancer Maternal Uncle    Heart attack Paternal Uncle    Cancer Paternal Grandfather    Heart attack Paternal Grandfather    Hypertension Paternal Grandfather    Allergies: No Known Allergies Medications: See med rec.  Review of Systems: No headache, visual changes, nausea, vomiting, diarrhea, constipation, dizziness, abdominal pain, skin rash, fevers, chills, night sweats, swollen lymph nodes, weight loss, chest pain, body  aches, joint swelling, muscle aches, shortness of breath, mood changes, visual or auditory hallucinations.  Objective:    General: Well Developed, well nourished, and in no acute distress.  Neuro: Alert and oriented x3, extra-ocular muscles intact, sensation grossly intact. Cranial nerves II through XII are intact, motor, sensory, and coordinative functions are all intact. HEENT: Normocephalic, atraumatic, pupils equal round reactive to light, neck supple, no masses, no lymphadenopathy, thyroid nonpalpable. Oropharynx, nasopharynx, external ear canals are unremarkable. Skin: Warm and dry, no rashes noted.  Cardiac: Regular rate and rhythm, no murmurs rubs or gallops.  Respiratory: Clear to auscultation bilaterally. Not using accessory muscles, speaking in full sentences.  Abdominal: Soft, nontender, nondistended, positive bowel sounds, no masses, no organomegaly.  Musculoskeletal: Shoulder, elbow, wrist, hip, knee, ankle stable, and with full range of motion.  Impression and Recommendations:    The patient was counselled, risk factors were discussed, anticipatory guidance given.  Adult ADHD Mandy returns, was initially doing well on 5 to 10 mg of Adderall, she is now not well-controlled and having some insomnia, worsening anxiety, discontinue Adderall. We we will consider Strattera in the future.  Annual physical exam Annual physical as above, we received her OB/GYN records and her Pap is due this year, referral to [facility] for cervical cancer screening. Declines flu and Tdap today.  Generalized anxiety disorder Currently uncontrolled, she has  lost 20 pounds, she has been fighting insomnia by using Xanax nightly, we will discontinue her Adderall, she will discontinue nightly use of Xanax and we will add trazodone 50 mg at night for 2 weeks with the option to go up to 100 or 150 mg if need be. Return to see me in 6 weeks and we can  reevaluate.   ____________________________________________ Ihor Austin. Benjamin Stain, M.D., ABFM., CAQSM., AME. Primary Care and Sports Medicine [facility]  Adjunct Professor of Family Medicine  [place] [employer]  Restaurant manager, fast food

## 2023-09-03 NOTE — Assessment & Plan Note (Addendum)
Annual physical as above, we received her OB/GYN records and her Pap is due this year, referral to [facility] for cervical cancer screening. Declines flu and Tdap today.

## 2023-09-03 NOTE — Assessment & Plan Note (Signed)
Ananya returns, was initially doing well on 5 to 10 mg of Adderall, she is now not well-controlled and having some insomnia, worsening anxiety, discontinue Adderall. We we will consider Strattera in the future.

## 2023-09-03 NOTE — Assessment & Plan Note (Signed)
Currently uncontrolled, she has lost 20 pounds, she has been fighting insomnia by using Xanax nightly, we will discontinue her Adderall, she will discontinue nightly use of Xanax and we will add trazodone 50 mg at night for 2 weeks with the option to go up to 100 or 150 mg if need be. Return to see me in 6 weeks and we can reevaluate.

## 2023-09-04 LAB — CBC
Hematocrit: 42.9 % (ref 34.0–46.6)
Hemoglobin: 14.6 g/dL (ref 11.1–15.9)
MCH: 33.4 pg — ABNORMAL HIGH (ref 26.6–33.0)
MCHC: 34 g/dL (ref 31.5–35.7)
MCV: 98 fL — ABNORMAL HIGH (ref 79–97)
Platelets: 233 10*3/uL (ref 150–450)
RBC: 4.37 x10E6/uL (ref 3.77–5.28)
RDW: 12.5 % (ref 11.7–15.4)
WBC: 5.7 10*3/uL (ref 3.4–10.8)

## 2023-09-04 LAB — COMPREHENSIVE METABOLIC PANEL
ALT: 19 [IU]/L (ref 0–32)
AST: 17 [IU]/L (ref 0–40)
Albumin: 4.2 g/dL (ref 3.9–4.9)
Alkaline Phosphatase: 57 [IU]/L (ref 44–121)
BUN/Creatinine Ratio: 16 (ref 9–23)
BUN: 12 mg/dL (ref 6–20)
Bilirubin Total: 0.8 mg/dL (ref 0.0–1.2)
CO2: 20 mmol/L (ref 20–29)
Calcium: 9.4 mg/dL (ref 8.7–10.2)
Chloride: 102 mmol/L (ref 96–106)
Creatinine, Ser: 0.76 mg/dL (ref 0.57–1.00)
Globulin, Total: 2.4 g/dL (ref 1.5–4.5)
Glucose: 85 mg/dL (ref 70–99)
Potassium: 4.2 mmol/L (ref 3.5–5.2)
Sodium: 137 mmol/L (ref 134–144)
Total Protein: 6.6 g/dL (ref 6.0–8.5)
eGFR: 103 mL/min/{1.73_m2} (ref >59)

## 2023-09-04 LAB — LIPID PANEL
Chol/HDL Ratio: 2.3 ratio (ref 0.0–4.4)
Cholesterol, Total: 165 mg/dL (ref 100–199)
HDL: 71 mg/dL (ref >39)
LDL Chol Calc (NIH): 78 mg/dL (ref 0–99)
Triglycerides: 88 mg/dL (ref 0–149)
VLDL Cholesterol Cal: 16 mg/dL (ref 5–40)

## 2023-09-04 LAB — HEMOGLOBIN A1C
Est. average glucose Bld gHb Est-mCnc: 111 mg/dL
Hgb A1c MFr Bld: 5.5 % (ref 4.8–5.6)

## 2023-09-04 LAB — TSH: TSH: 0.921 u[IU]/mL (ref 0.450–4.500)

## 2023-09-26 ENCOUNTER — Other Ambulatory Visit: Payer: Self-pay | Admitting: Sports Medicine

## 2023-09-26 DIAGNOSIS — F411 Generalized anxiety disorder: Secondary | ICD-10-CM

## 2023-10-01 ENCOUNTER — Other Ambulatory Visit: Payer: Self-pay | Admitting: Medical-Surgical

## 2023-10-01 ENCOUNTER — Encounter: Payer: Self-pay | Admitting: Medical-Surgical

## 2023-10-01 ENCOUNTER — Ambulatory Visit (INDEPENDENT_AMBULATORY_CARE_PROVIDER_SITE_OTHER): Payer: BC Managed Care – PPO | Admitting: Medical-Surgical

## 2023-10-01 VITALS — BP 111/71 | HR 71 | Resp 20 | Ht 65.0 in | Wt 126.7 lb

## 2023-10-01 DIAGNOSIS — Z124 Encounter for screening for malignant neoplasm of cervix: Secondary | ICD-10-CM | POA: Diagnosis not present

## 2023-10-01 NOTE — Progress Notes (Signed)
        Established patient visit  History, exam, impression, and plan:  1. Cervical cancer screening Pleasant 37 year old female presenting today for completion of cervical cancer screening.  She thinks her last Pap smear may have been 2 years ago but is not sure.  Reports that her last 1 was normal.  Denies any concerns with vaginal symptoms.  Declined STI testing.  Pap smear completed with HPV cotesting.  If normal, she will be due for repeat in 5 years. - Cytology - PAP  Physical Exam Vitals reviewed. Exam conducted with a chaperone present.  Constitutional:      General: She is not in acute distress.    Appearance: Normal appearance. She is not ill-appearing.  HENT:     Head: Normocephalic and atraumatic.  Cardiovascular:     Rate and Rhythm: Normal rate and regular rhythm.  Pulmonary:     Effort: Pulmonary effort is normal. No respiratory distress.  Genitourinary:    General: Normal vulva.     Exam position: Lithotomy position.     Labia:        Right: No rash, tenderness, lesion or injury.        Left: No rash, tenderness, lesion or injury.      Vagina: Normal.     Cervix: Normal.     Uterus: Normal.      Adnexa: Right adnexa normal and left adnexa normal.  Neurological:     Mental Status: She is alert.   Procedures performed this visit: None.  Return if symptoms worsen or fail to improve.  __________________________________ Thayer Ohm, DNP, APRN, FNP-BC Primary Care and Sports Medicine [facility] [place]

## 2023-10-07 LAB — IGP, APTIMA HPV: HPV Aptima: NEGATIVE

## 2023-10-15 ENCOUNTER — Ambulatory Visit (INDEPENDENT_AMBULATORY_CARE_PROVIDER_SITE_OTHER): Payer: BC Managed Care – PPO | Admitting: Sports Medicine

## 2023-10-15 VITALS — BP 135/82 | HR 65 | Ht 65.0 in | Wt 126.0 lb

## 2023-10-15 DIAGNOSIS — F411 Generalized anxiety disorder: Secondary | ICD-10-CM

## 2023-10-15 DIAGNOSIS — F909 Attention-deficit hyperactivity disorder, unspecified type: Secondary | ICD-10-CM

## 2023-10-15 DIAGNOSIS — L4050 Arthropathic psoriasis, unspecified: Secondary | ICD-10-CM

## 2023-10-15 MED ORDER — TRAZODONE HCL 100 MG PO TABS: 100.0000 mg | ORAL_TABLET | Freq: Every day | ORAL | 3 refills | Status: DC

## 2023-10-15 MED ORDER — CLOBETASOL PROPIONATE 0.05 % EX CREA: 1.0000 | TOPICAL_CREAM | Freq: Two times a day (BID) | CUTANEOUS | 11 refills | Status: AC

## 2023-10-15 NOTE — Assessment & Plan Note (Signed)
Overall doing well with the discontinuance of Adderall. Strattera continues to be an option for the future.

## 2023-10-15 NOTE — Assessment & Plan Note (Signed)
Anxiety and insomnia much better with trazodone, she discontinued the nightly use of Xanax per Currently landed on 100 mg nightly, she does have some vivid dreams and difficulty turning off her mind at night. We did discuss the importance of mindfulness and avoiding stimulating type activities such as social media. She plans to stop this at about 8 PM. Follow-up in 6 weeks as needed.

## 2023-10-15 NOTE — Progress Notes (Signed)
    Procedures performed today:    None.  Independent interpretation of notes and tests performed by another provider:   None.  Brief History, Exam, Impression, and Recommendations:    Adult ADHD Overall doing well with the discontinuance of Adderall. Strattera continues to be an option for the future.  Generalized anxiety disorder Anxiety and insomnia much better with trazodone, she discontinued the nightly use of Xanax per Currently landed on 100 mg nightly, she does have some vivid dreams and difficulty turning off her mind at night. We did discuss the importance of mindfulness and avoiding stimulating type activities such as social media. She plans to stop this at about 8 PM. Follow-up in 6 weeks as needed.    ____________________________________________ Ihor Austin. Benjamin Stain, M.D., ABFM., CAQSM., AME. Primary Care and Sports Medicine [facility]  Adjunct Professor of Family Medicine  [place] [employer]  Restaurant manager, fast food

## 2023-10-15 NOTE — Addendum Note (Signed)
Addended by: Monica Becton on: 10/15/2023 10:04 AM   Modules accepted: Orders

## 2023-10-25 ENCOUNTER — Encounter: Payer: Self-pay | Admitting: Sports Medicine

## 2023-11-26 ENCOUNTER — Ambulatory Visit: Payer: BC Managed Care – PPO | Admitting: Sports Medicine

## 2023-12-17 ENCOUNTER — Ambulatory Visit: Payer: BC Managed Care – PPO | Admitting: Sports Medicine

## 2023-12-31 ENCOUNTER — Ambulatory Visit (INDEPENDENT_AMBULATORY_CARE_PROVIDER_SITE_OTHER): Payer: BC Managed Care – PPO | Admitting: Sports Medicine

## 2023-12-31 ENCOUNTER — Encounter: Payer: Self-pay | Admitting: Sports Medicine

## 2023-12-31 DIAGNOSIS — R102 Pelvic and perineal pain unspecified side: Secondary | ICD-10-CM | POA: Insufficient documentation

## 2023-12-31 DIAGNOSIS — F411 Generalized anxiety disorder: Secondary | ICD-10-CM | POA: Diagnosis not present

## 2023-12-31 MED ORDER — MELOXICAM 15 MG PO TABS
ORAL_TABLET | ORAL | 3 refills | Status: AC
Start: 2023-12-31 — End: ?

## 2023-12-31 NOTE — Assessment & Plan Note (Addendum)
 Alice Johnston brings up her pelvic pain again, left lower quadrant, sometimes about 2 weeks after her cycle. She did have a gynecologist and had a pelvic and transvaginal ultrasound about a year ago that was normal. Pain is not always referable to her cycles. It is not reproducible with palpation or movement of her hip, she did have a hip MRI also that was normal recently. On exam she does have some minimal tenderness over the pubic symphysis but not concordant. Abdominal exam was normal. I do suspect that this pelvic pain approximate 2 weeks after cycle may represent mittelschmerz I discussed this with her, endometriosis is also in the differential, I did explain the treatment approach for dysmenorrhea, ultimately we rule out ominous pathologies and masses with imaging, and then treat with medication, we will start fairly conservatively with meloxicam approximately a couple of days before she expects to get her pain, I would also like consultation with gynecology.

## 2023-12-31 NOTE — Assessment & Plan Note (Signed)
 Anxiety and insomnia are much better with trazodone, she has discontinued nightly use of Xanax, we landed on 100 mg nightly, she does get some vivid dreams but is doing better, she still wakes up at about 4 AM. She has been continuing to work on sleep hygiene including avoiding her phone and social media after about 8 PM. At this juncture we are going to bump up her trazodone to 150 mg and do a 4-week follow-up.

## 2023-12-31 NOTE — Progress Notes (Signed)
    Procedures performed today:    None.  Independent interpretation of notes and tests performed by another provider:   None.  Brief History, Exam, Impression, and Recommendations:    Pelvic pain Alice Johnston brings up her pelvic pain again, left lower quadrant, sometimes about 2 weeks after her cycle. She did have a gynecologist and had a pelvic and transvaginal ultrasound about a year ago that was normal. Pain is not always referable to her cycles. It is not reproducible with palpation or movement of her hip, she did have a hip MRI also that was normal recently. On exam she does have some minimal tenderness over the pubic symphysis but not concordant. Abdominal exam was normal. I do suspect that this pelvic pain approximate 2 weeks after cycle may represent mittelschmerz I discussed this with her, endometriosis is also in the differential, I did explain the treatment approach for dysmenorrhea, ultimately we rule out ominous pathologies and masses with imaging, and then treat with medication, we will start fairly conservatively with meloxicam approximately a couple of days before she expects to get her pain, I would also like consultation with gynecology.   Generalized anxiety disorder Anxiety and insomnia are much better with trazodone, she has discontinued nightly use of Xanax, we landed on 100 mg nightly, she does get some vivid dreams but is doing better, she still wakes up at about 4 AM. She has been continuing to work on sleep hygiene including avoiding her phone and social media after about 8 PM. At this juncture we are going to bump up her trazodone to 150 mg and do a 4-week follow-up.    ____________________________________________ Alice Johnston. Alice Johnston, M.D., ABFM., CAQSM., AME. Primary Care and Sports Medicine [facility]  Adjunct Professor of Family Medicine  [place] [employer]  Restaurant manager, fast food

## 2024-01-12 DIAGNOSIS — R0981 Nasal congestion: Secondary | ICD-10-CM | POA: Diagnosis not present

## 2024-01-12 DIAGNOSIS — R070 Pain in throat: Secondary | ICD-10-CM | POA: Diagnosis not present

## 2024-01-12 DIAGNOSIS — J302 Other seasonal allergic rhinitis: Secondary | ICD-10-CM | POA: Diagnosis not present

## 2024-04-16 ENCOUNTER — Encounter: Payer: Self-pay | Admitting: Sports Medicine

## 2024-04-16 DIAGNOSIS — F411 Generalized anxiety disorder: Secondary | ICD-10-CM

## 2024-04-17 MED ORDER — TRAZODONE HCL 150 MG PO TABS: 150.0000 mg | ORAL_TABLET | Freq: Every day | ORAL | 3 refills | Status: AC

## 2024-06-27 ENCOUNTER — Encounter: Payer: Self-pay | Admitting: Sports Medicine
